# Patient Record
Sex: Female | Born: 1935 | Race: White | Hispanic: No | Smoking: Never smoker
Health system: Southern US, Community
[De-identification: ages and names within clinical notes are randomized; demographics above are authoritative.]

## PROBLEM LIST (undated history)

## (undated) DIAGNOSIS — E119 Type 2 diabetes mellitus without complications: Secondary | ICD-10-CM

## (undated) DIAGNOSIS — I10 Essential (primary) hypertension: Secondary | ICD-10-CM

## (undated) DIAGNOSIS — R112 Nausea with vomiting, unspecified: Secondary | ICD-10-CM

## (undated) DIAGNOSIS — Z9889 Other specified postprocedural states: Secondary | ICD-10-CM

## (undated) DIAGNOSIS — H9190 Unspecified hearing loss, unspecified ear: Secondary | ICD-10-CM

## (undated) DIAGNOSIS — M1990 Unspecified osteoarthritis, unspecified site: Secondary | ICD-10-CM

## (undated) DIAGNOSIS — E7800 Pure hypercholesterolemia, unspecified: Secondary | ICD-10-CM

## (undated) DIAGNOSIS — H8109 Meniere's disease, unspecified ear: Secondary | ICD-10-CM

## (undated) HISTORY — PX: CATARACT EXTRACTION: SUR2

## (undated) HISTORY — PX: ROTATOR CUFF REPAIR: SHX139

## (undated) HISTORY — PX: TONSILLECTOMY: SUR1361

## (undated) HISTORY — PX: OTHER SURGICAL HISTORY: SHX169

## (undated) HISTORY — PX: JOINT REPLACEMENT: SHX530

## (undated) HISTORY — PX: KNEE ARTHROSCOPY: SUR90

## (undated) HISTORY — PX: CERVICAL FUSION: SHX112

## (undated) HISTORY — PX: ABDOMINAL HYSTERECTOMY: SHX81

## (undated) HISTORY — PX: CHOLECYSTECTOMY: SHX55

---

## 2005-12-16 ENCOUNTER — Emergency Department: Payer: Self-pay | Admitting: Emergency Medicine

## 2005-12-20 ENCOUNTER — Emergency Department: Payer: Self-pay | Admitting: Emergency Medicine

## 2005-12-20 ENCOUNTER — Other Ambulatory Visit: Payer: Self-pay

## 2005-12-24 ENCOUNTER — Emergency Department: Payer: Self-pay | Admitting: Emergency Medicine

## 2006-07-15 ENCOUNTER — Ambulatory Visit: Payer: Self-pay | Admitting: Family Medicine

## 2007-12-24 ENCOUNTER — Ambulatory Visit: Payer: Self-pay | Admitting: Internal Medicine

## 2007-12-26 ENCOUNTER — Ambulatory Visit: Payer: Self-pay | Admitting: Internal Medicine

## 2008-01-02 ENCOUNTER — Ambulatory Visit: Payer: Self-pay | Admitting: Family Medicine

## 2009-09-20 ENCOUNTER — Ambulatory Visit: Payer: Self-pay | Admitting: Ophthalmology

## 2009-10-06 ENCOUNTER — Ambulatory Visit: Payer: Self-pay | Admitting: Unknown Physician Specialty

## 2010-10-10 ENCOUNTER — Ambulatory Visit: Payer: Self-pay | Admitting: Unknown Physician Specialty

## 2010-11-21 ENCOUNTER — Emergency Department: Payer: Self-pay | Admitting: Emergency Medicine

## 2011-07-03 IMAGING — CR NASAL BONES - 3+ VIEW
1 series · 3 of 3 positions shown · non-contrast
Comparison: none

REASON FOR EXAM: swelling and pain after a fall
COMMENTS:

[Series 1: view not recorded · 0.17mm/px · 3 of 3 slices shown]
[im 1/3]
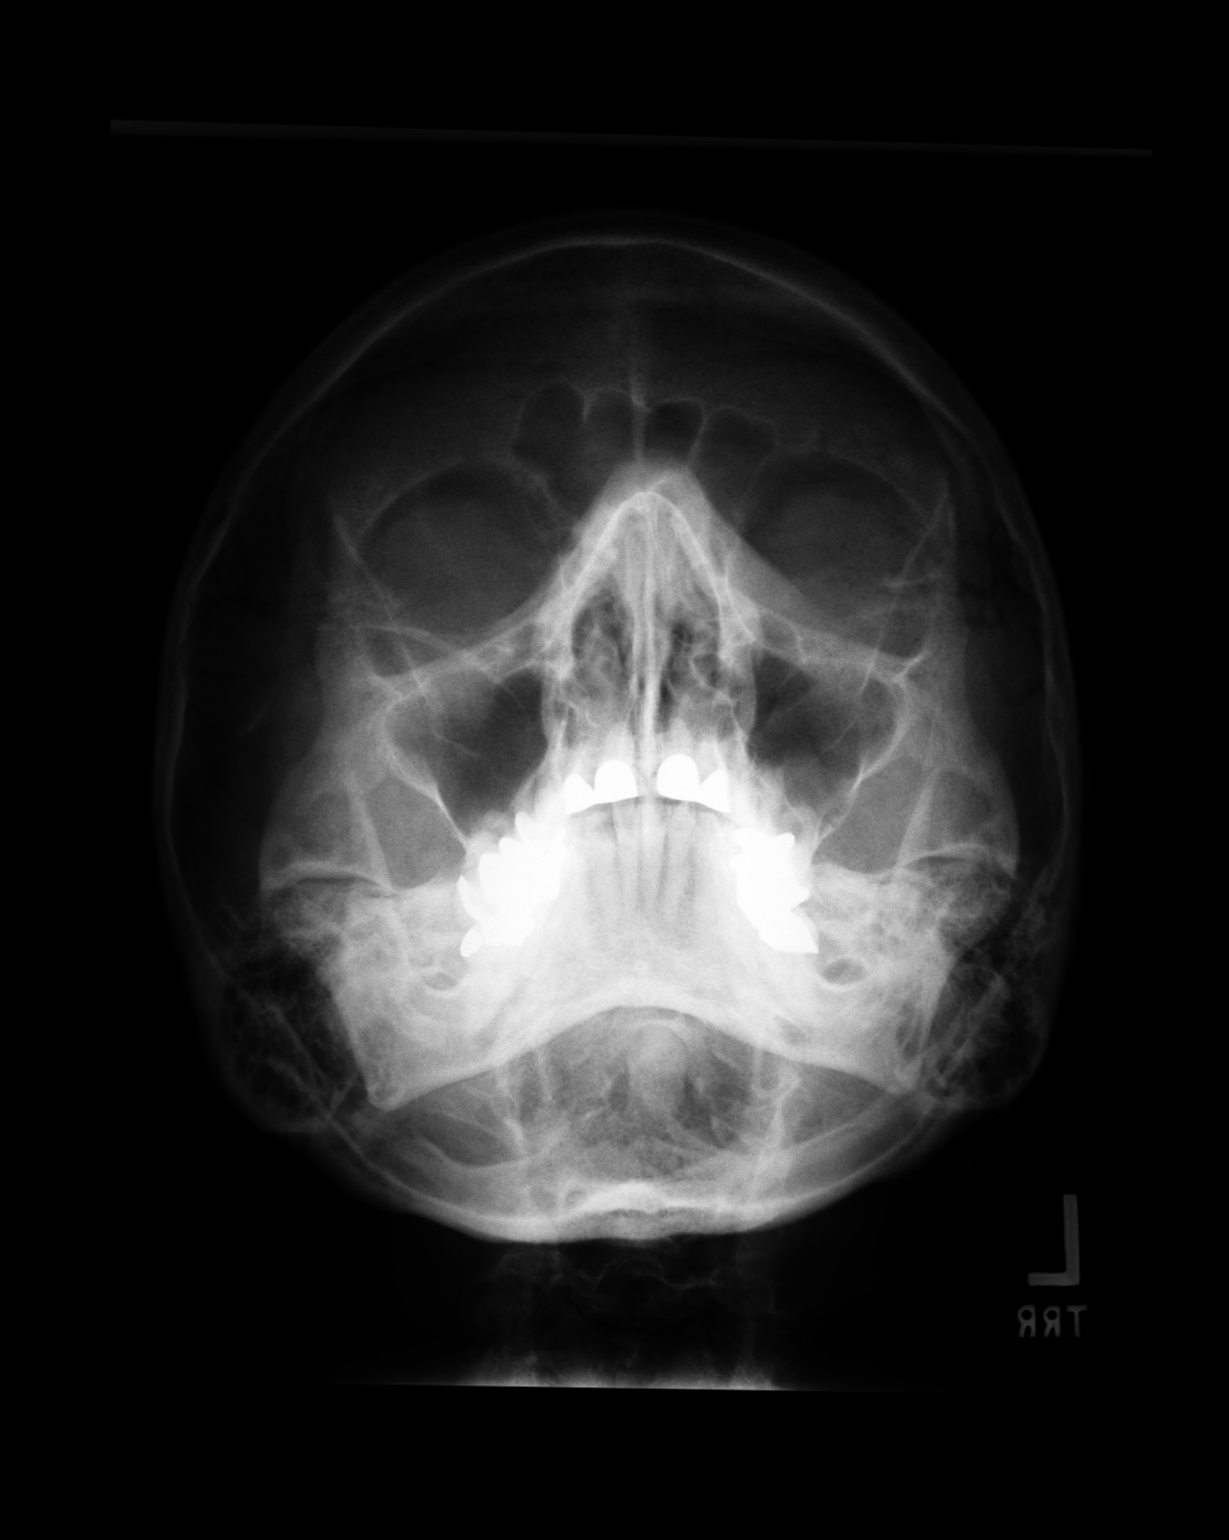
[im 2/3]
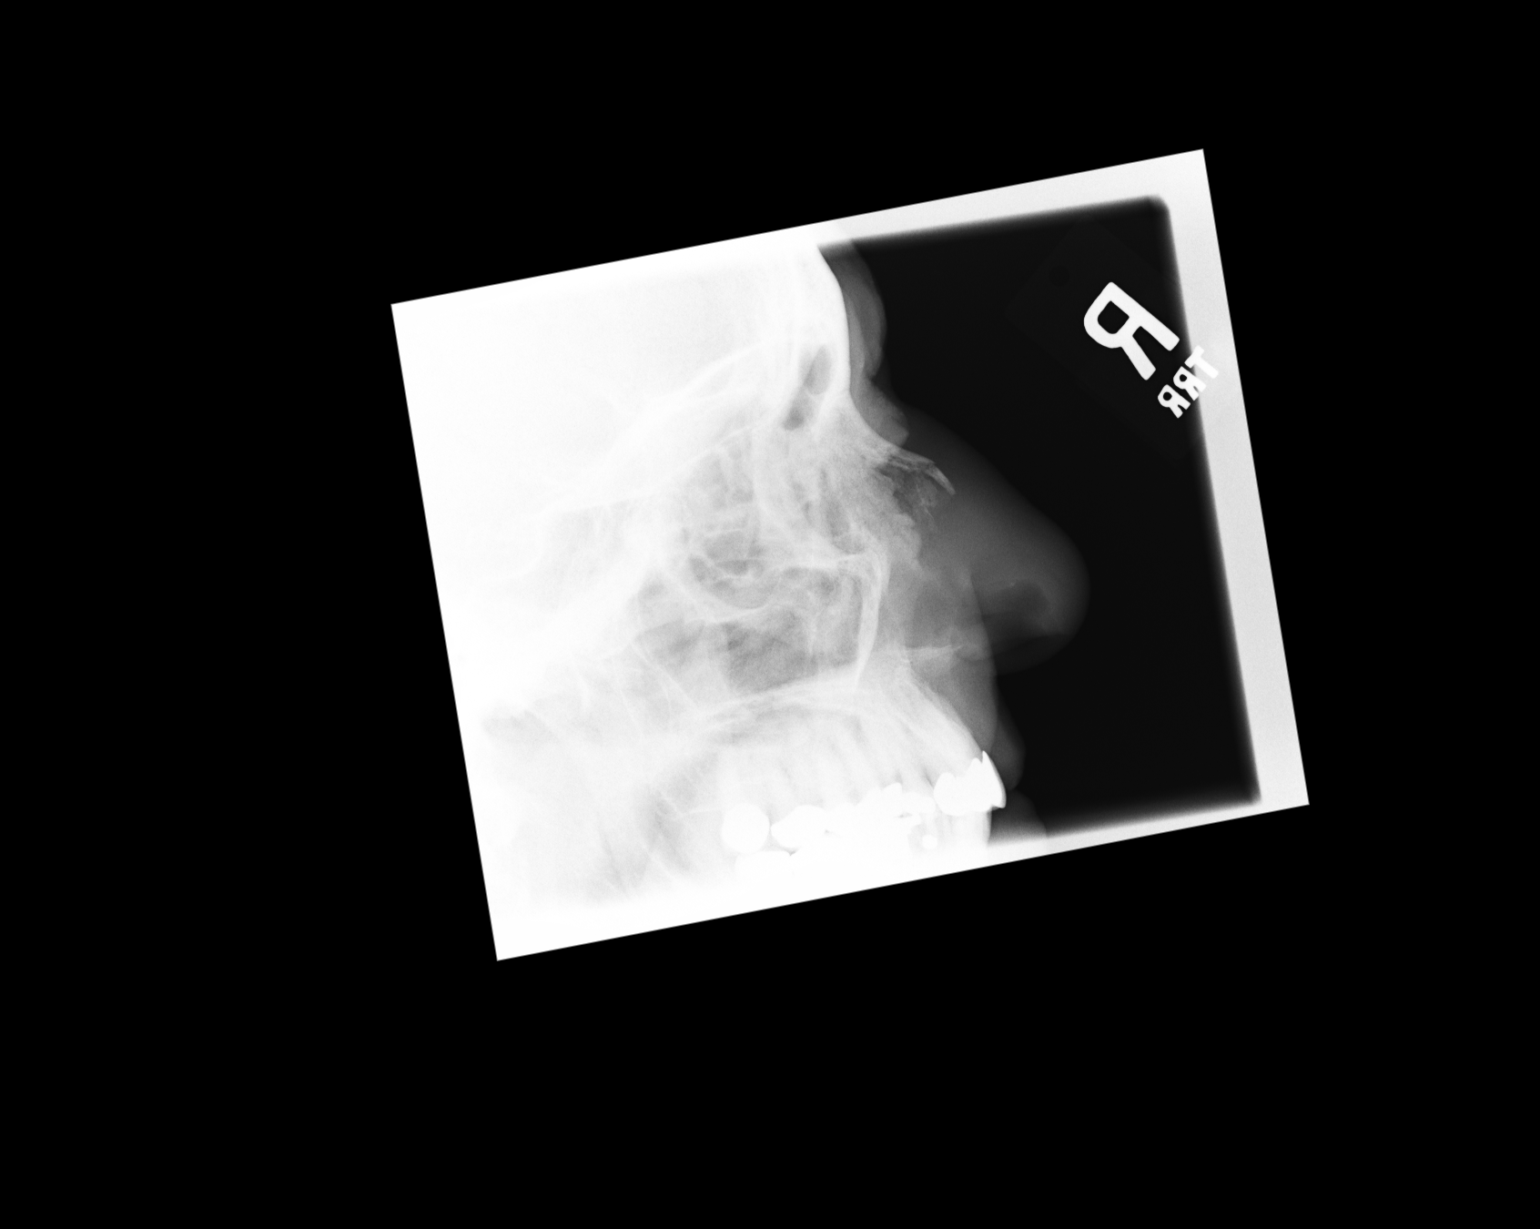
[im 3/3]
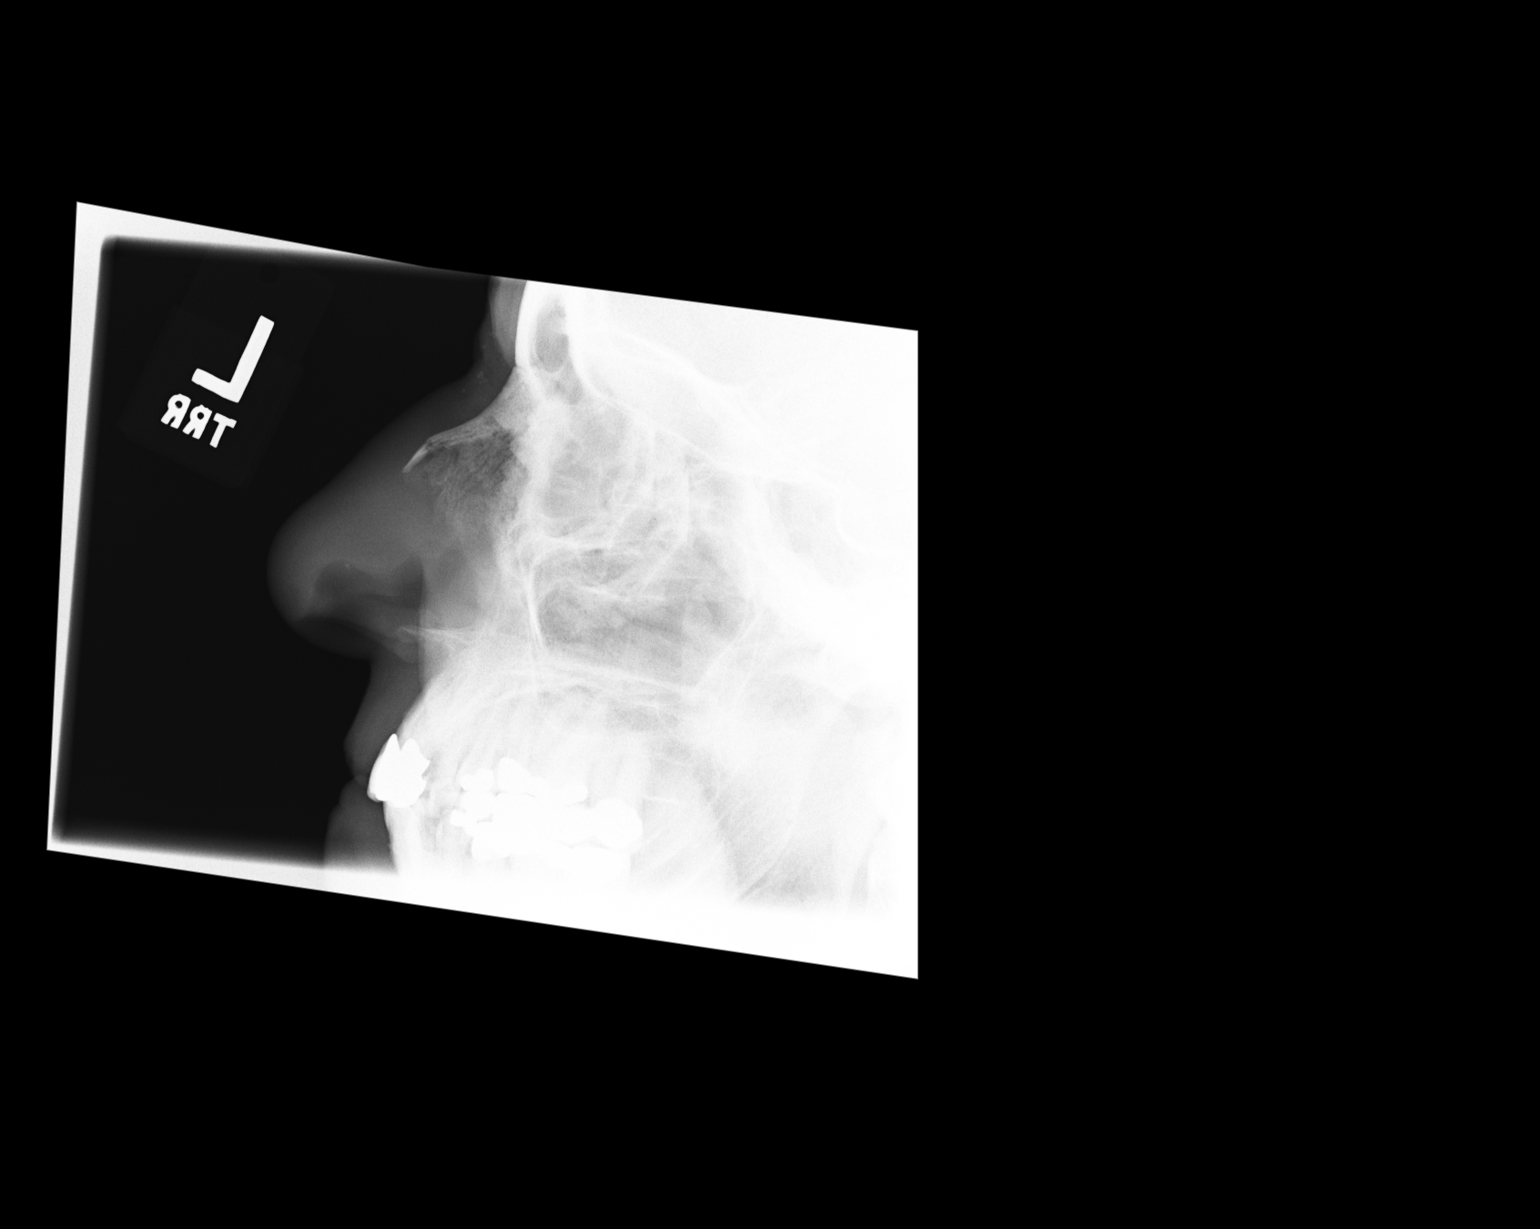

[3 of 3 positions shown; findings below may reference images not displayed]

PROCEDURE:     DXR - DXR NASAL BONES  - November 21, 2010  [DATE]

RESULT:     Three views of the nasal bones are submitted. The patient has
sustained an acute fracture which is slightly depressed through the tip of
the nasal bone. The nasal spine is grossly intact. As best as can be
determined the nasal septum is not displaced.
IMPRESSION: The patient has sustained a slightly depressed fracture
through the tip of the nasal bone. The degree of depression is no more than
1 to 2 mm.

## 2012-04-11 DIAGNOSIS — E119 Type 2 diabetes mellitus without complications: Secondary | ICD-10-CM | POA: Insufficient documentation

## 2013-01-26 DIAGNOSIS — Z96659 Presence of unspecified artificial knee joint: Secondary | ICD-10-CM | POA: Insufficient documentation

## 2013-10-12 DIAGNOSIS — T84069A Wear of articular bearing surface of unspecified internal prosthetic joint, initial encounter: Secondary | ICD-10-CM | POA: Insufficient documentation

## 2013-12-16 DIAGNOSIS — G8918 Other acute postprocedural pain: Secondary | ICD-10-CM | POA: Insufficient documentation

## 2014-02-08 ENCOUNTER — Ambulatory Visit: Payer: MEDICARE | Admitting: Podiatry

## 2014-02-24 ENCOUNTER — Ambulatory Visit (INDEPENDENT_AMBULATORY_CARE_PROVIDER_SITE_OTHER): Payer: MEDICARE | Admitting: Podiatry

## 2014-02-24 ENCOUNTER — Ambulatory Visit (INDEPENDENT_AMBULATORY_CARE_PROVIDER_SITE_OTHER): Payer: MEDICARE

## 2014-02-24 ENCOUNTER — Encounter: Payer: Self-pay | Admitting: Podiatry

## 2014-02-24 VITALS — BP 145/81 | HR 91 | Resp 16 | Ht 62.0 in | Wt 145.0 lb

## 2014-02-24 DIAGNOSIS — M2040 Other hammer toe(s) (acquired), unspecified foot: Secondary | ICD-10-CM

## 2014-02-24 NOTE — Progress Notes (Signed)
   Subjective:    Patient ID: Kristin Deleon, female    DOB: 02/27/1928, 80 y.o.   MRN: [id number]  HPI Comments: i have a hammer toe on my rt foot - 2nd. i have bunionettes on both feet. i want him to look at my inserts. Ive had feet problems since June 2015. My feet are stable. My hammer toe hurts. As long as i keep a Band-Aid on my toe it will not hurt. i dont do anything for my feet.  Foot Pain      Review of Systems  HENT: Positive for hearing loss.        Ringing in ears  Musculoskeletal:       Joint pain  All other systems reviewed and are negative.      Objective:   Physical Exam: I have reviewed her past medical history medications allergies surgeries social history and review of systems. Pulses are strongly palpable bilateral. Neurologic sensorium is intact per Semmes-Weinstein monofilament. Deep tendon reflexes are intact bilateral muscle strength is 5 over 5 dorsiflexors plantar flexors inverters everters all intrinsic musculature is intact. 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 I feel that this is more than likely level her off to some degree if she is having less symptomatology. She is however developing a hammertoe deformity with medial and dislocation of the second metatarsophalangeal joint associated with a plantar plate tear more than likely secondary to a history of walking with a severe limp for long period time. Cutaneous evaluation Mr. Is supple well hydrated cutis there is no erythema edema cellulitis drainage or odor. Radiographic evaluation demonstrates osteoarthritis and osteopenia. Hammertoe deformities are noted.   Assessment: Leg length discrepancy with hammertoe deformity  Plan: Continue the use of her orthotics of followup with me as needed.       Assessment & Plan:

## 2014-06-23 DIAGNOSIS — G8929 Other chronic pain: Secondary | ICD-10-CM | POA: Insufficient documentation

## 2014-06-23 DIAGNOSIS — M25512 Pain in left shoulder: Secondary | ICD-10-CM | POA: Insufficient documentation

## 2014-06-23 DIAGNOSIS — M25551 Pain in right hip: Secondary | ICD-10-CM | POA: Insufficient documentation

## 2014-06-23 DIAGNOSIS — M25552 Pain in left hip: Secondary | ICD-10-CM | POA: Insufficient documentation

## 2014-11-06 DIAGNOSIS — S12110K Anterior displaced Type II dens fracture, subsequent encounter for fracture with nonunion: Secondary | ICD-10-CM | POA: Insufficient documentation

## 2014-12-14 DIAGNOSIS — M4322 Fusion of spine, cervical region: Secondary | ICD-10-CM | POA: Insufficient documentation

## 2016-08-23 ENCOUNTER — Other Ambulatory Visit: Payer: Self-pay | Admitting: Family Medicine

## 2016-08-23 DIAGNOSIS — E041 Nontoxic single thyroid nodule: Secondary | ICD-10-CM

## 2016-08-28 ENCOUNTER — Ambulatory Visit
Admission: RE | Admit: 2016-08-28 | Discharge: 2016-08-28 | Disposition: A | Discharge status: Home / Self Care | Payer: MEDICARE | Source: Ambulatory Visit | Attending: Family Medicine | Admitting: Family Medicine

## 2016-08-28 DIAGNOSIS — E041 Nontoxic single thyroid nodule: Secondary | ICD-10-CM | POA: Diagnosis present

## 2016-08-28 DIAGNOSIS — E042 Nontoxic multinodular goiter: Secondary | ICD-10-CM | POA: Diagnosis not present

## 2016-09-18 NOTE — Discharge Instructions (Signed)
Cataract Surgery, Care After °Refer to this sheet in the next few weeks. These instructions provide you with information about caring for yourself after your procedure. Your health care provider may also give you more specific instructions. Your treatment has been planned according to current medical practices, but problems sometimes occur. Call your health care provider if you have any problems or questions after your procedure. °What can I expect after the procedure? °After the procedure, it is common to have: °· Itching. °· Discomfort. °· Fluid discharge. °· Sensitivity to light and to touch. °· Bruising. °Follow these instructions at home: °Eye Care  °· Check your eye every day for signs of infection. Watch for: °¨ Redness, swelling, or pain. °¨ Fluid, blood, or pus. °¨ Warmth. °¨ Bad smell. °Activity  °· Avoid strenuous activities, such as playing contact sports, for as long as told by your health care provider. °· Do not drive or operate heavy machinery until your health care provider approves. °· Do not bend or lift heavy objects . Bending increases pressure in the eye. You can walk, climb stairs, and do light household chores. °· Ask your health care provider when you can return to work. If you work in a dusty environment, you may be advised to wear protective eyewear for a period of time. °General instructions  °· Take or apply over-the-counter and prescription medicines only as told by your health care provider. This includes eye drops. °· Do not touch or rub your eyes. °· If you were given a protective shield, wear it as told by your health care provider. If you were not given a protective shield, wear sunglasses as told by your health care provider to protect your eyes. °· Keep the area around your eye clean and dry. Avoid swimming or allowing water to hit you directly in the face while showering until told by your health care provider. Keep soap and shampoo out of your eyes. °· Do not put a contact lens  into the affected eye or eyes until your health care provider approves. °· Keep all follow-up visits as told by your health care provider. This is important. °Contact a health care provider if: ° °· You have increased bruising around your eye. °· You have pain that is not helped with medicine. °· You have a fever. °· You have redness, swelling, or pain in your eye. °· You have fluid, blood, or pus coming from your incision. °· Your vision gets worse. °Get help right away if: °· You have sudden vision loss. °This information is not intended to replace advice given to you by your health care provider. Make sure you discuss any questions you have with your health care provider. °Document Released: 12/15/2004 Document Revised: 10/06/2015 Document Reviewed: 04/07/2015 °Elsevier Interactive Patient Education © 2017 [employer] ° ° ° ° °General Anesthesia, Adult, Care After °These instructions provide you with information about caring for yourself after your procedure. Your health care provider may also give you more specific instructions. Your treatment has been planned according to current medical practices, but problems sometimes occur. Call your health care provider if you have any problems or questions after your procedure. °What can I expect after the procedure? °After the procedure, it is common to have: °· Vomiting. °· A sore throat. °· Mental slowness. °It is common to feel: °· Nauseous. °· Cold or shivery. °· Sleepy. °· Tired. °· Sore or achy, even in parts of your body where you did not have surgery. °Follow these instructions at   home: °For at least 24 hours after the procedure:  °· Do not: °¨ Participate in activities where you could fall or become injured. °¨ Drive. °¨ Use heavy machinery. °¨ Drink alcohol. °¨ Take sleeping pills or medicines that cause drowsiness. °¨ Make important decisions or sign legal documents. °¨ Take care of children on your own. °· Rest. °Eating and drinking  °· If you vomit, drink  water, juice, or soup when you can drink without vomiting. °· Drink enough fluid to keep your urine clear or pale yellow. °· Make sure you have little or no nausea before eating solid foods. °· Follow the diet recommended by your health care provider. °General instructions  °· Have a responsible adult stay with you until you are awake and alert. °· Return to your normal activities as told by your health care provider. Ask your health care provider what activities are safe for you. °· Take over-the-counter and prescription medicines only as told by your health care provider. °· If you smoke, do not smoke without supervision. °· Keep all follow-up visits as told by your health care provider. This is important. °Contact a health care provider if: °· You continue to have nausea or vomiting at home, and medicines are not helpful. °· You cannot drink fluids or start eating again. °· You cannot urinate after 8-12 hours. °· You develop a skin rash. °· You have fever. °· You have increasing redness at the site of your procedure. °Get help right away if: °· You have difficulty breathing. °· You have chest pain. °· You have unexpected bleeding. °· You feel that you are having a life-threatening or urgent problem. °This information is not intended to replace advice given to you by your health care provider. Make sure you discuss any questions you have with your health care provider. °Document Released: 09/03/2000 Document Revised: 10/31/2015 Document Reviewed: 05/12/2015 °Elsevier Interactive Patient Education © 2017 [employer] ° °

## 2016-09-19 ENCOUNTER — Ambulatory Visit: Payer: MEDICARE | Admitting: Student in an Organized Health Care Education/Training Program

## 2016-09-19 ENCOUNTER — Encounter
Admission: RE | Disposition: A | Discharge status: Home / Self Care | Payer: Self-pay | Source: Ambulatory Visit | Attending: Ophthalmology

## 2016-09-19 ENCOUNTER — Ambulatory Visit
Admission: RE | Admit: 2016-09-19 | Discharge: 2016-09-19 | Disposition: A | Discharge status: Home / Self Care | Payer: MEDICARE | Source: Ambulatory Visit | Attending: Ophthalmology | Admitting: Ophthalmology

## 2016-09-19 DIAGNOSIS — Z96653 Presence of artificial knee joint, bilateral: Secondary | ICD-10-CM | POA: Insufficient documentation

## 2016-09-19 DIAGNOSIS — Z7984 Long term (current) use of oral hypoglycemic drugs: Secondary | ICD-10-CM | POA: Insufficient documentation

## 2016-09-19 DIAGNOSIS — Z888 Allergy status to other drugs, medicaments and biological substances status: Secondary | ICD-10-CM | POA: Diagnosis not present

## 2016-09-19 DIAGNOSIS — E7800 Pure hypercholesterolemia, unspecified: Secondary | ICD-10-CM | POA: Diagnosis not present

## 2016-09-19 DIAGNOSIS — Z885 Allergy status to narcotic agent status: Secondary | ICD-10-CM | POA: Insufficient documentation

## 2016-09-19 DIAGNOSIS — H2511 Age-related nuclear cataract, right eye: Secondary | ICD-10-CM | POA: Diagnosis present

## 2016-09-19 DIAGNOSIS — Z7982 Long term (current) use of aspirin: Secondary | ICD-10-CM | POA: Diagnosis not present

## 2016-09-19 DIAGNOSIS — Z96643 Presence of artificial hip joint, bilateral: Secondary | ICD-10-CM | POA: Insufficient documentation

## 2016-09-19 DIAGNOSIS — I10 Essential (primary) hypertension: Secondary | ICD-10-CM | POA: Insufficient documentation

## 2016-09-19 DIAGNOSIS — Z881 Allergy status to other antibiotic agents status: Secondary | ICD-10-CM | POA: Diagnosis not present

## 2016-09-19 DIAGNOSIS — E1136 Type 2 diabetes mellitus with diabetic cataract: Secondary | ICD-10-CM | POA: Insufficient documentation

## 2016-09-19 DIAGNOSIS — Z79899 Other long term (current) drug therapy: Secondary | ICD-10-CM | POA: Insufficient documentation

## 2016-09-19 HISTORY — DX: Type 2 diabetes mellitus without complications: E11.9

## 2016-09-19 HISTORY — PX: CATARACT EXTRACTION W/PHACO: SHX586

## 2016-09-19 HISTORY — DX: Unspecified osteoarthritis, unspecified site: M19.90

## 2016-09-19 HISTORY — DX: Meniere's disease, unspecified ear: H81.09

## 2016-09-19 HISTORY — DX: Nausea with vomiting, unspecified: R11.2

## 2016-09-19 HISTORY — DX: Nausea with vomiting, unspecified: Z98.890

## 2016-09-19 HISTORY — DX: Unspecified hearing loss, unspecified ear: H91.90

## 2016-09-19 HISTORY — DX: Pure hypercholesterolemia, unspecified: E78.00

## 2016-09-19 HISTORY — DX: Essential (primary) hypertension: I10

## 2016-09-19 LAB — GLUCOSE, CAPILLARY
GLUCOSE-CAPILLARY: 129 mg/dL — AB (ref 65–99)
Glucose-Capillary: 156 mg/dL — ABNORMAL HIGH (ref 65–99)

## 2016-09-19 SURGERY — PHACOEMULSIFICATION, CATARACT, WITH IOL INSERTION
Anesthesia: Monitor Anesthesia Care | Site: Eye | Laterality: Right | Wound class: Clean

## 2016-09-19 MED ORDER — FENTANYL CITRATE (PF) 100 MCG/2ML IJ SOLN
INTRAMUSCULAR | Status: DC | PRN
  Administered 2016-09-19 (×2): 50 ug via INTRAVENOUS

## 2016-09-19 MED ORDER — MIDAZOLAM HCL 2 MG/2ML IJ SOLN
INTRAMUSCULAR | Status: DC | PRN
  Administered 2016-09-19 (×2): 0.5 mg via INTRAVENOUS

## 2016-09-19 MED ORDER — LIDOCAINE HCL (PF) 2 % IJ SOLN
INTRAOCULAR | Status: DC | PRN
  Administered 2016-09-19: 1 mL via INTRAOCULAR

## 2016-09-19 MED ORDER — LACTATED RINGERS IV SOLN: INTRAVENOUS | Status: DC

## 2016-09-19 MED ORDER — ONDANSETRON HCL 4 MG/2ML IJ SOLN
4.0000 mg | Freq: Once | INTRAMUSCULAR | Status: AC
  Administered 2016-09-19: 2 mg via INTRAVENOUS

## 2016-09-19 MED ORDER — ARMC OPHTHALMIC DILATING DROPS
1.0000 "application " | OPHTHALMIC | Status: DC | PRN
  Administered 2016-09-19 (×3): 1 via OPHTHALMIC

## 2016-09-19 MED ORDER — MOXIFLOXACIN HCL 0.5 % OP SOLN
1.0000 [drp] | OPHTHALMIC | Status: DC | PRN
  Administered 2016-09-19 (×3): 1 [drp] via OPHTHALMIC

## 2016-09-19 MED ORDER — EPINEPHRINE PF 1 MG/ML IJ SOLN
INTRAOCULAR | Status: DC | PRN
  Administered 2016-09-19: 56 mL via OPHTHALMIC

## 2016-09-19 MED ORDER — CEFUROXIME OPHTHALMIC INJECTION 1 MG/0.1 ML
INJECTION | OPHTHALMIC | Status: DC | PRN
Start: 2016-09-19 — End: 2016-09-19
  Administered 2016-09-19: 0.1 mL via INTRACAMERAL

## 2016-09-19 MED ORDER — BRIMONIDINE TARTRATE-TIMOLOL 0.2-0.5 % OP SOLN
OPHTHALMIC | Status: DC | PRN
  Administered 2016-09-19: 1 [drp] via OPHTHALMIC

## 2016-09-19 MED ORDER — NA HYALUR & NA CHOND-NA HYALUR 0.4-0.35 ML IO KIT
PACK | INTRAOCULAR | Status: DC | PRN
  Administered 2016-09-19: 1 mL via INTRAOCULAR

## 2016-09-19 SURGICAL SUPPLY — 26 items
CANNULA ANT/CHMB 27GA (MISCELLANEOUS) ×2 IMPLANT
CARTRIDGE ABBOTT (MISCELLANEOUS) IMPLANT
GLOVE SURG LX 7.5 STRW (GLOVE) ×1
GLOVE SURG LX STRL 7.5 STRW (GLOVE) ×1 IMPLANT
GLOVE SURG TRIUMPH 8.0 PF LTX (GLOVE) ×2 IMPLANT
GOWN STRL REUS W/ TWL LRG LVL3 (GOWN DISPOSABLE) ×2 IMPLANT
GOWN STRL REUS W/TWL LRG LVL3 (GOWN DISPOSABLE) ×2
LENS IOL ACRSF IQ ULTRA 24.0 (Intraocular Lens) ×1 IMPLANT
LENS IOL ACRYSOF IQ 24.0 (Intraocular Lens) ×2 IMPLANT
MARKER SKIN DUAL TIP RULER LAB (MISCELLANEOUS) ×2 IMPLANT
NDL RETROBULBAR .5 NSTRL (NEEDLE) IMPLANT
NEEDLE FILTER BLUNT 18X 1/2SAF (NEEDLE) ×1
NEEDLE FILTER BLUNT 18X1 1/2 (NEEDLE) ×1 IMPLANT
PACK CATARACT BRASINGTON (MISCELLANEOUS) ×2 IMPLANT
PACK EYE AFTER SURG (MISCELLANEOUS) ×2 IMPLANT
PACK OPTHALMIC (MISCELLANEOUS) ×2 IMPLANT
RING MALYGIN 7.0 (MISCELLANEOUS) IMPLANT
SUT ETHILON 10-0 CS-B-6CS-B-6 (SUTURE)
SUT VICRYL  9 0 (SUTURE)
SUT VICRYL 9 0 (SUTURE) IMPLANT
SUTURE EHLN 10-0 CS-B-6CS-B-6 (SUTURE) IMPLANT
SYR 3ML LL SCALE MARK (SYRINGE) ×2 IMPLANT
SYR 5ML LL (SYRINGE) ×2 IMPLANT
SYR TB 1ML LUER SLIP (SYRINGE) ×2 IMPLANT
WATER STERILE IRR 250ML POUR (IV SOLUTION) ×2 IMPLANT
WIPE NON LINTING 3.25X3.25 (MISCELLANEOUS) ×2 IMPLANT

## 2016-09-19 NOTE — Transfer of Care (Signed)
Immediate Anesthesia Transfer of Care Note  Patient: Kristin Deleon  Procedure(s) Performed: Procedure(s) with comments: CATARACT EXTRACTION PHACO AND INTRAOCULAR LENS PLACEMENT (IOC) Diabetic  Right eye (Right) - Diabetic-oral med  Patient Location: PACU  Anesthesia Type: MAC  Level of Consciousness: awake, alert  and patient cooperative  Airway and Oxygen Therapy: Patient Spontanous Breathing and Patient connected to supplemental oxygen  Post-op Assessment: Post-op Vital signs reviewed, Patient's Cardiovascular Status Stable, Respiratory Function Stable, Patent Airway and No signs of Nausea or vomiting  Post-op Vital Signs: Reviewed and stable  Complications: No apparent anesthesia complications

## 2016-09-19 NOTE — Op Note (Signed)
LOCATION:  [facility]   PREOPERATIVE DIAGNOSIS:    Nuclear sclerotic cataract right eye. H25.11   POSTOPERATIVE DIAGNOSIS:  Nuclear sclerotic cataract right eye.     PROCEDURE:  Phacoemusification with posterior chamber intraocular lens placement of the right eye   LENS:   Implant Name Type Inv. Item Serial No. Manufacturer Lot No. LRB No. Used  LENS IOL ACRYSOF IQ 24.0 - [id number] Intraocular Lens LENS IOL ACRYSOF IQ 24.0 [id number] [employer]CON [id number] Right 1        ULTRASOUND TIME: 22 % of 1 minutes, 18 seconds.  CDE 17.0   SURGEON:  Deirdre Evener, MD   ANESTHESIA:  Topical with tetracaine drops and 2% Xylocaine jelly, augmented with 1% preservative-free intracameral lidocaine.    COMPLICATIONS:  None.   DESCRIPTION OF PROCEDURE:  The patient was identified in the holding room and transported to the operating room and placed in the supine position under the operating microscope.  The right eye was identified as the operative eye and it was prepped and draped in the usual sterile ophthalmic fashion.   A 1 millimeter clear-corneal paracentesis was made at the 12:00 position.  0.5 ml of preservative-free 1% lidocaine was injected into the anterior chamber. The anterior chamber was filled with Viscoat viscoelastic.  A 2.4 millimeter keratome was used to make a near-clear corneal incision at the 9:00 position.  A curvilinear capsulorrhexis was made with a cystotome and capsulorrhexis forceps.  Balanced salt solution was used to hydrodissect and hydrodelineate the nucleus.   Phacoemulsification was then used in stop and chop fashion to remove the lens nucleus and epinucleus.  The remaining cortex was then removed using the irrigation and aspiration handpiece. Provisc was then placed into the capsular bag to distend it for lens placement.  A lens was then injected into the capsular bag.  The remaining viscoelastic was aspirated.   Wounds were hydrated with balanced salt  solution.  The anterior chamber was inflated to a physiologic pressure with balanced salt solution.  No wound leaks were noted. Cefuroxime 0.1 ml of a /ml solution was injected into the anterior chamber for a dose of 1 mg of intracameral antibiotic at the completion of the case.   Timolol and Brimonidine drops were applied to the eye.  The patient was taken to the recovery room in stable condition without complications of anesthesia or surgery.   Kristin Deleon 09/19/2016, 8:38 AM

## 2016-09-19 NOTE — Anesthesia Preprocedure Evaluation (Signed)
Anesthesia Evaluation  Patient identified by MRN, date of birth, ID band Patient awake    Reviewed: Allergy & Precautions, H&P , NPO status , Patient's Chart, lab work & pertinent test results  Airway Mallampati: II  TM Distance: >3 FB Neck ROM: limited   Comment: h/o C1-C2 fusion Dental no notable dental hx.    Pulmonary neg pulmonary ROS,    Pulmonary exam normal        Cardiovascular hypertension, On Medications Normal cardiovascular exam     Neuro/Psych    GI/Hepatic negative GI ROS, Neg liver ROS,   Endo/Other  diabetes, Well Controlled  Renal/GU      Musculoskeletal   Abdominal   Peds  Hematology negative hematology ROS (+)   Anesthesia Other Findings   Reproductive/Obstetrics                             Anesthesia Physical Anesthesia Plan  ASA: II  Anesthesia Plan: MAC   Post-op Pain Management:    Induction:   Airway Management Planned:   Additional Equipment:   Intra-op Plan:   Post-operative Plan:   Informed Consent: I have reviewed the patients History and Physical, chart, labs and discussed the procedure including the risks, benefits and alternatives for the proposed anesthesia with the patient or authorized representative who has indicated his/her understanding and acceptance.     Plan Discussed with:   Anesthesia Plan Comments:         Anesthesia Quick Evaluation

## 2016-09-19 NOTE — Anesthesia Postprocedure Evaluation (Signed)
Anesthesia Post Note  Patient: Kristin Deleon  Procedure(s) Performed: Procedure(s) (LRB): CATARACT EXTRACTION PHACO AND INTRAOCULAR LENS PLACEMENT (IOC) Diabetic  Right eye (Right)  Patient location during evaluation: PACU Anesthesia Type: MAC Level of consciousness: awake Pain management: pain level controlled Vital Signs Assessment: post-procedure vital signs reviewed and stable Respiratory status: spontaneous breathing Cardiovascular status: blood pressure returned to baseline Postop Assessment: no headache Anesthetic complications: no    Jaci Standard, III,  Katelee Schupp D

## 2016-09-19 NOTE — H&P (Signed)
The History and Physical notes are on paper, have been signed, and are to be scanned. The patient remains stable and unchanged from the H&P.   Previous H&P reviewed, patient examined, and there are no changes.  Martyn Timme 09/19/2016 7:45 AM

## 2016-09-19 NOTE — Anesthesia Procedure Notes (Signed)
Procedure Name: MAC Date/Time: 09/19/2016 8:18 AM Performed by: Janna Arch Pre-anesthesia Checklist: Patient identified, Emergency Drugs available, Suction available and Patient being monitored Patient Re-evaluated:Patient Re-evaluated prior to inductionOxygen Delivery Method: Nasal cannula

## 2016-09-20 ENCOUNTER — Encounter: Payer: Self-pay | Admitting: Ophthalmology

## 2017-05-22 ENCOUNTER — Ambulatory Visit
Admission: RE | Admit: 2017-05-22 | Discharge: 2017-05-22 | Disposition: A | Discharge status: Home / Self Care | Payer: MEDICARE | Source: Ambulatory Visit | Attending: Gastroenterology | Admitting: Gastroenterology

## 2017-05-22 ENCOUNTER — Other Ambulatory Visit: Payer: Self-pay | Admitting: Gastroenterology

## 2017-05-22 DIAGNOSIS — R1310 Dysphagia, unspecified: Secondary | ICD-10-CM

## 2017-05-22 DIAGNOSIS — Z9889 Other specified postprocedural states: Secondary | ICD-10-CM | POA: Diagnosis not present

## 2017-05-22 DIAGNOSIS — M5030 Other cervical disc degeneration, unspecified cervical region: Secondary | ICD-10-CM | POA: Diagnosis not present

## 2017-05-22 DIAGNOSIS — M5382 Other specified dorsopathies, cervical region: Secondary | ICD-10-CM | POA: Diagnosis not present

## 2017-05-27 ENCOUNTER — Encounter: Payer: Self-pay | Admitting: *Deleted

## 2017-05-28 ENCOUNTER — Ambulatory Visit: Payer: MEDICARE | Admitting: Anesthesiology

## 2017-05-28 ENCOUNTER — Ambulatory Visit
Admission: RE | Admit: 2017-05-28 | Discharge: 2017-05-28 | Disposition: A | Discharge status: Home / Self Care | Payer: MEDICARE | Source: Ambulatory Visit | Attending: Internal Medicine | Admitting: Internal Medicine

## 2017-05-28 ENCOUNTER — Encounter
Admission: RE | Disposition: A | Discharge status: Home / Self Care | Payer: Self-pay | Source: Ambulatory Visit | Attending: Internal Medicine

## 2017-05-28 ENCOUNTER — Encounter: Payer: Self-pay | Admitting: *Deleted

## 2017-05-28 DIAGNOSIS — K449 Diaphragmatic hernia without obstruction or gangrene: Secondary | ICD-10-CM | POA: Insufficient documentation

## 2017-05-28 DIAGNOSIS — Z7982 Long term (current) use of aspirin: Secondary | ICD-10-CM | POA: Diagnosis not present

## 2017-05-28 DIAGNOSIS — R1313 Dysphagia, pharyngeal phase: Secondary | ICD-10-CM | POA: Insufficient documentation

## 2017-05-28 DIAGNOSIS — Z881 Allergy status to other antibiotic agents status: Secondary | ICD-10-CM | POA: Diagnosis not present

## 2017-05-28 DIAGNOSIS — K2970 Gastritis, unspecified, without bleeding: Secondary | ICD-10-CM | POA: Insufficient documentation

## 2017-05-28 DIAGNOSIS — E119 Type 2 diabetes mellitus without complications: Secondary | ICD-10-CM | POA: Insufficient documentation

## 2017-05-28 DIAGNOSIS — Z7984 Long term (current) use of oral hypoglycemic drugs: Secondary | ICD-10-CM | POA: Insufficient documentation

## 2017-05-28 DIAGNOSIS — E7800 Pure hypercholesterolemia, unspecified: Secondary | ICD-10-CM | POA: Diagnosis not present

## 2017-05-28 DIAGNOSIS — M1990 Unspecified osteoarthritis, unspecified site: Secondary | ICD-10-CM | POA: Diagnosis not present

## 2017-05-28 DIAGNOSIS — Q398 Other congenital malformations of esophagus: Secondary | ICD-10-CM | POA: Diagnosis not present

## 2017-05-28 DIAGNOSIS — Z79899 Other long term (current) drug therapy: Secondary | ICD-10-CM | POA: Diagnosis not present

## 2017-05-28 DIAGNOSIS — I10 Essential (primary) hypertension: Secondary | ICD-10-CM | POA: Diagnosis not present

## 2017-05-28 DIAGNOSIS — M180 Bilateral primary osteoarthritis of first carpometacarpal joints: Secondary | ICD-10-CM | POA: Insufficient documentation

## 2017-05-28 DIAGNOSIS — M47812 Spondylosis without myelopathy or radiculopathy, cervical region: Secondary | ICD-10-CM | POA: Insufficient documentation

## 2017-05-28 DIAGNOSIS — Z885 Allergy status to narcotic agent status: Secondary | ICD-10-CM | POA: Insufficient documentation

## 2017-05-28 DIAGNOSIS — Z888 Allergy status to other drugs, medicaments and biological substances status: Secondary | ICD-10-CM | POA: Insufficient documentation

## 2017-05-28 HISTORY — PX: ESOPHAGOGASTRODUODENOSCOPY (EGD) WITH PROPOFOL: SHX5813

## 2017-05-28 LAB — GLUCOSE, CAPILLARY: Glucose-Capillary: 101 mg/dL — ABNORMAL HIGH (ref 65–99)

## 2017-05-28 SURGERY — ESOPHAGOGASTRODUODENOSCOPY (EGD) WITH PROPOFOL
Anesthesia: General

## 2017-05-28 MED ORDER — LIDOCAINE HCL (CARDIAC) 20 MG/ML IV SOLN
INTRAVENOUS | Status: DC | PRN
  Administered 2017-05-28: 50 mg via INTRAVENOUS

## 2017-05-28 MED ORDER — LIDOCAINE HCL (PF) 2 % IJ SOLN
INTRAMUSCULAR | Status: AC
  Filled 2017-05-28: qty 10

## 2017-05-28 MED ORDER — PROPOFOL 500 MG/50ML IV EMUL
INTRAVENOUS | Status: AC
  Filled 2017-05-28: qty 50

## 2017-05-28 MED ORDER — PROPOFOL 10 MG/ML IV BOLUS
INTRAVENOUS | Status: DC | PRN
  Administered 2017-05-28: 10 mg via INTRAVENOUS
  Administered 2017-05-28: 20 mg via INTRAVENOUS
  Administered 2017-05-28: 10 mg via INTRAVENOUS
  Administered 2017-05-28: 40 mg via INTRAVENOUS

## 2017-05-28 MED ORDER — SODIUM CHLORIDE 0.9 % IV SOLN
INTRAVENOUS | Status: DC | PRN
  Administered 2017-05-28: 13:00:00 via INTRAVENOUS

## 2017-05-28 NOTE — Anesthesia Preprocedure Evaluation (Signed)
Anesthesia Evaluation  Patient identified by MRN, date of birth, ID band Patient awake    Reviewed: Allergy & Precautions, NPO status , Patient's Chart, lab work & pertinent test results  History of Anesthesia Complications (+) PONV  Airway Mallampati: II       Dental  (+) Teeth Intact   Pulmonary neg pulmonary ROS,    breath sounds clear to auscultation       Cardiovascular Exercise Tolerance: Good hypertension, Pt. on medications  Rhythm:Regular Rate:Normal     Neuro/Psych negative neurological ROS  negative psych ROS   GI/Hepatic negative GI ROS, Neg liver ROS,   Endo/Other  diabetes, Type 2, Oral Hypoglycemic Agents  Renal/GU      Musculoskeletal  (+) Arthritis ,   Abdominal Normal abdominal exam  (+)   Peds negative pediatric ROS (+)  Hematology   Anesthesia Other Findings   Reproductive/Obstetrics                             Anesthesia Physical Anesthesia Plan  ASA: II  Anesthesia Plan: General   Post-op Pain Management:    Induction: Intravenous  PONV Risk Score and Plan: 1 and Ondansetron and Dexamethasone  Airway Management Planned: Natural Airway and Nasal Cannula  Additional Equipment:   Intra-op Plan:   Post-operative Plan:   Informed Consent: I have reviewed the patients History and Physical, chart, labs and discussed the procedure including the risks, benefits and alternatives for the proposed anesthesia with the patient or authorized representative who has indicated his/her understanding and acceptance.     Plan Discussed with: CRNA  Anesthesia Plan Comments:         Anesthesia Quick Evaluation

## 2017-05-28 NOTE — Anesthesia Postprocedure Evaluation (Signed)
Anesthesia Post Note  Patient: Kristin Deleon  Procedure(s) Performed: ESOPHAGOGASTRODUODENOSCOPY (EGD) WITH PROPOFOL (N/A )  Patient location during evaluation: PACU Anesthesia Type: General Level of consciousness: awake Pain management: pain level controlled Vital Signs Assessment: post-procedure vital signs reviewed and stable Respiratory status: spontaneous breathing Cardiovascular status: stable Anesthetic complications: no     Last Vitals:  Vitals:   05/28/17 1345 05/28/17 1414  BP: (!) 159/93   Pulse: 70   Resp: 20   Temp: 37 C 36.6 C  SpO2: 93%     Last Pain:  Vitals:   05/28/17 1414  TempSrc: Tympanic                 VAN STAVEREN,Kamyiah Colantonio

## 2017-05-28 NOTE — Transfer of Care (Signed)
Immediate Anesthesia Transfer of Care Note  Patient: Kristin Deleon E Scholz  Procedure(s) Performed: ESOPHAGOGASTRODUODENOSCOPY (EGD) WITH PROPOFOL (N/A )  Patient Location: PACU  Anesthesia Type:General  Level of Consciousness: sedated and responds to stimulation  Airway & Oxygen Therapy: Patient Spontanous Breathing and Patient connected to nasal cannula oxygen  Post-op Assessment: Report given to RN and Post -op Vital signs reviewed and stable  Post vital signs: Reviewed and stable  Last Vitals:  Vitals:   05/28/17 1305 05/28/17 1345  BP: (!) 163/101 (!) 159/93  Pulse: 88 70  Resp: 20 20  Temp: (!) 36 C   SpO2:  93%    Last Pain:  Vitals:   05/28/17 1305  TempSrc: Oral         Complications: No apparent anesthesia complications

## 2017-05-28 NOTE — H&P (Signed)
Outpatient short stay form Pre-procedure 05/28/2017 1:04 PM Teodoro K. Norma Fredricksonoledo, M.D.  Primary Physician: Angus PalmsSionne George, M.D.  Reason for visit:  Dysphagia  History of present illness:  Patient is an 83 y/o female set up for EGD today by Vevelyn Pathristiane London, NP for dysphagia. Patient has hx of throat discomfort and cough while trying to swallow pills. 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 No Nsaids. Had a negative indirect laryngoscopy by ENT. No hx of stoke, seizures, parkinson's or other neuro diseases. No anticoagulant medications.   No current facility-administered medications for this encounter.   Medications Prior to Admission  Medication Sig Dispense Refill Last Dose  . ipratropium (ATROVENT) 0.03 % nasal spray Place 2 sprays into both nostrils 2 (two) times daily.     . niacin (NIASPAN) 500 MG CR tablet Take 500 mg by mouth at bedtime. Extended release     . triamcinolone cream (KENALOG) 0.1 % Apply 1 application topically as needed. Apply externally to the affected area twice daily     . aspirin EC 81 MG tablet Take 81 mg by mouth daily. am   09/18/2016 at Unknown time  . Calcium Carbonate-Vit D-Min (CALTRATE 600+D PLUS PO) Take by mouth. am   09/18/2016 at Unknown time  . Cholecalciferol (VITAMIN D3) 2000 units TABS Take by mouth daily. am   09/18/2016 at Unknown time  . CINNAMON PO Take 1,000 mg by mouth daily. am   09/18/2016 at Unknown time  . Coenzyme Q10 (COQ10) 200 MG CAPS Take by mouth. Dinner   09/18/2016 at Unknown time  . Cranberry-Cholecalciferol 4200-500 MG-UNIT CAPS Take by mouth daily. dinner   09/18/2016 at Unknown time  . diphenhydramine-acetaminophen (TYLENOL PM) 25-500 MG TABS tablet Take 1 tablet by mouth at bedtime as needed.   09/18/2016 at Unknown time  . Latanoprost (XALATAN OP) Apply 0.005 % to eye at bedtime. 1 drop per eye hour of sleep   09/18/2016 at Unknown time  . losartan (COZAAR) 50 MG tablet Take by mouth. breakfast   09/18/2016 at Unknown  time  . metFORMIN (GLUCOPHAGE) 1000 MG tablet Take 1,000 mg by mouth 2 (two) times daily with a meal. Breakfast and dinner   09/18/2016 at Unknown time  . Multiple Vitamins-Minerals (CENTRUM SILVER 50+WOMEN PO) Take by mouth. am   09/18/2016 at Unknown time  . Multiple Vitamins-Minerals (OCUVITE ADULT 50+ PO) Take by mouth daily. dinner   09/18/2016 at Unknown time  . Omega-3 Fatty Acids (FISH OIL) 1200 MG CAPS Take by mouth 2 (two) times daily. Breakfast and dinner   09/18/2016 at Unknown time  . Red Yeast Rice 600 MG CAPS Take 2 capsules by mouth daily. dinner   09/18/2016 at Unknown time  . vitamin B-12 (CYANOCOBALAMIN) 500 MCG tablet Take 500 mcg by mouth daily. dinner   09/18/2016 at Unknown time     Allergies  Allergen Reactions  . Codeine Nausea And Vomiting  . Fentanyl Nausea And Vomiting  . Hydromorphone Hcl Nausea And Vomiting  . Morphine Itching and Nausea And Vomiting    Other reaction(s): Vomiting/ feels like ants under skin  . Nucynta [Tapentadol] Other (See Comments)    hallucinations  . Statins     Muscle pain   . Tegaderm Ag Mesh [Silver] Other (See Comments)    blisters  . Tetracyclines & Related Rash     Past Medical History:  Diagnosis Date  . 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 Diabetes mellitus without complication (  HCC)    metformin-oral meds  . HOH (hard of hearing)    hearing aids/ bilateral  . Hypercholesteremia   . Hypertension    moderate/ controlled on meds  . Meniere syndrome   . PONV (postoperative nausea and vomiting)    difficulty waking up    Review of systems:      Physical Exam  General appearance: alert, cooperative and appears stated age Resp: clear to auscultation bilaterally Cardio: regular rate and rhythm, S1, S2 normal, no murmur, click, rub or gallop GI: soft, non-tender; bowel sounds normal; no masses,  no organomegaly     Planned procedures: EGD. The patient understands the nature of the planned procedure, indications, risks,  alternatives and potential complications including but not limited to bleeding, infection, perforation, damage to internal organs and possible oversedation/side effects from anesthesia. The patient agrees and gives consent to proceed.  Please refer to procedure notes for findings, recommendations and patient disposition/instructions.    Teodoro K. Norma Fredricksonoledo, M.D. Gastroenterology 05/28/2017  1:04 PM

## 2017-05-28 NOTE — Anesthesia Post-op Follow-up Note (Signed)
Anesthesia QCDR form completed.        

## 2017-05-28 NOTE — Anesthesia Procedure Notes (Signed)
Performed by: Anokhi Shannon, CRNA Pre-anesthesia Checklist: Patient identified, Emergency Drugs available, Suction available, Patient being monitored and Timeout performed Patient Re-evaluated:Patient Re-evaluated prior to induction Oxygen Delivery Method: Nasal cannula Induction Type: IV induction       

## 2017-05-28 NOTE — Op Note (Signed)
[facility] Patient Name: [name] Procedure Date: 05/28/2017 1:28 PM MRN: [id number] Account #: [id number] Date of Birth: 07-26-1927 Admit Type: Outpatient Age: 83 Room: [facility] ENDO ROOM 4 Gender: Female Note Status: Finalized Procedure:            Upper GI endoscopy Indications:          Pharyngeal phase dysphagia Providers:            Boykin Nearing. Norma Fredrickson MD, MD Referring MD:         Marylin Crosby. Greggory Stallion MD, MD (Referring MD) Medicines:            Propofol per Anesthesia Complications:        No immediate complications. Procedure:            Pre-Anesthesia Assessment:                       - The risks and benefits of the procedure and the                        sedation options and risks were discussed with the                        patient. All questions were answered and informed                        consent was obtained.                       - Patient identification and proposed procedure were                        verified prior to the procedure by the nurse. The                        procedure was verified in the procedure room.                       - ASA Grade Assessment: III - A patient with severe                        systemic disease.                       - After reviewing the risks and benefits, the patient                        was deemed in satisfactory condition to undergo the                        procedure.                       After obtaining informed consent, the endoscope was                        passed under direct vision. Throughout the procedure,                        the patient's blood pressure, pulse, and oxygen  saturations were monitored continuously. The Endoscope                        was introduced through the mouth, and advanced to the                        third part of duodenum. The upper GI endoscopy was                        accomplished without difficulty. The patient  tolerated                        the procedure well. Findings:      The mid esophagus was moderately tortuous.      There is no endoscopic evidence of stenosis, stricture or mass in the       lower third of the esophagus. Impression:           - Tortuous esophagus.                       - No specimens collected.                       - Gastritis.                       - Small sliding hiatal hernia. Recommendation:       - Patient has a contact number available for                        emergencies. The signs and symptoms of potential                        delayed complications were discussed with the patient.                        Return to normal activities tomorrow. Written discharge                        instructions were provided to the patient.                       - Resume previous diet.                       - Continue present medications.                       - I anticipate no further need for intervention.                       - Perform a modified barium swallow in 1 week.                       - Return to nurse practitioner in 1 month.                       - The findings and recommendations were discussed with                        the patient and their family. Procedure Code(s):    --- Professional ---  [id number], Esophagogastroduodenoscopy, flexible, transoral;                        diagnostic, including collection of specimen(s) by                        brushing or washing, when performed (separate procedure) Diagnosis Code(s):    --- Professional ---                       R13.13, Dysphagia, pharyngeal phase                       K44.9, Diaphragmatic hernia without obstruction or                        gangrene                       K29.70, Gastritis, unspecified, without bleeding                       Q39.9, Congenital malformation of esophagus, unspecified CPT copyright 2016 American Medical Association. All rights reserved. The codes  documented in this report are preliminary and upon coder review may  be revised to meet current compliance requirements. Stanton Kidneyeodoro K Latrish Mogel MD, MD 05/28/2017 1:46:34 PM This report has been signed electronically. Number of Addenda: 0 Note Initiated On: 05/28/2017 1:28 PM      [facility]

## 2017-05-29 ENCOUNTER — Other Ambulatory Visit: Payer: Self-pay | Admitting: Internal Medicine

## 2017-05-29 ENCOUNTER — Encounter: Payer: Self-pay | Admitting: Internal Medicine

## 2017-05-29 DIAGNOSIS — R1310 Dysphagia, unspecified: Secondary | ICD-10-CM

## 2017-06-14 ENCOUNTER — Ambulatory Visit
Admission: RE | Admit: 2017-06-14 | Discharge: 2017-06-14 | Disposition: A | Discharge status: Home / Self Care | Payer: Medicare HMO | Source: Ambulatory Visit | Attending: Internal Medicine | Admitting: Internal Medicine

## 2017-06-14 DIAGNOSIS — E119 Type 2 diabetes mellitus without complications: Secondary | ICD-10-CM | POA: Insufficient documentation

## 2017-06-14 DIAGNOSIS — M19041 Primary osteoarthritis, right hand: Secondary | ICD-10-CM | POA: Insufficient documentation

## 2017-06-14 DIAGNOSIS — H8109 Meniere's disease, unspecified ear: Secondary | ICD-10-CM | POA: Diagnosis not present

## 2017-06-14 DIAGNOSIS — E7800 Pure hypercholesterolemia, unspecified: Secondary | ICD-10-CM | POA: Insufficient documentation

## 2017-06-14 DIAGNOSIS — M19042 Primary osteoarthritis, left hand: Secondary | ICD-10-CM | POA: Insufficient documentation

## 2017-06-14 DIAGNOSIS — R1312 Dysphagia, oropharyngeal phase: Secondary | ICD-10-CM | POA: Diagnosis not present

## 2017-06-14 DIAGNOSIS — I10 Essential (primary) hypertension: Secondary | ICD-10-CM | POA: Insufficient documentation

## 2017-06-14 NOTE — Therapy (Signed)
[facility] [address] [place], [place], [address] Phone: [phone]   Fax:     Modified Barium Swallow  Patient Details  Name: Kristin Deleon MRN: [id number] Date of Birth: 04/22/1928 No Data Recorded  Encounter Date: 06/14/2017  End of Session - 06/14/17 1329    Visit Number  1    Number of Visits  1    Date for SLP Re-Evaluation  06/14/17    SLP Start Time  1245    SLP Stop Time   1330    SLP Time Calculation (min)  45 min    Activity Tolerance  Patient tolerated treatment well       Past Medical History:  Diagnosis Date  . 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 Diabetes mellitus without complication (HCC)    metformin-oral meds  . HOH (hard of hearing)    hearing aids/ bilateral  . Hypercholesteremia   . Hypertension    moderate/ controlled on meds  . Meniere syndrome   . PONV (postoperative nausea and vomiting)    difficulty waking up    Past Surgical History:  Procedure Laterality Date  . ABDOMINAL HYSTERECTOMY    . CATARACT EXTRACTION    . CATARACT EXTRACTION W/PHACO Right 09/19/2016   Procedure: CATARACT EXTRACTION PHACO AND INTRAOCULAR LENS PLACEMENT (IOC) Diabetic  Right eye;  Surgeon: Lockie Molahadwick Brasington, MD;  [facility];  Service: Ophthalmology;  Laterality: Right;  Diabetic-oral med  . CERVICAL FUSION     for fracture C1 and C2  . CHOLECYSTECTOMY    . ESOPHAGOGASTRODUODENOSCOPY (EGD) WITH PROPOFOL N/A 05/28/2017   Procedure: ESOPHAGOGASTRODUODENOSCOPY (EGD) WITH PROPOFOL;  Surgeon: Toledo, Boykin Nearingeodoro K, MD;  Location: [facility] ENDOSCOPY;  Service: Gastroenterology;  Laterality: N/A;  . JOINT REPLACEMENT Bilateral    knee and hip  . KNEE ARTHROSCOPY     multiple  . Morton's neuroma removed from foot    . ROTATOR CUFF REPAIR Right   . TONSILLECTOMY      There were no vitals filed for this visit.   Subjective: Patient behavior: (alertness, ability to follow instructions, etc.):  The patient is able to express her swallowing concerns and follow directions.  Chief complaint: sudden onset of difficulty swallowing, increased choking with liquids, and unintended weight loss.   Objective:  Radiological Procedure: A videoflouroscopic evaluation of oral-preparatory, reflex initiation, and pharyngeal phases of the swallow was performed; as well as a screening of the upper esophageal phase.  I. POSTURE: Upright in MBS chair, due to surgerical procedures, the patient has elevated shoulders and chin tuck  II. VIEW: Lateral  III. COMPENSATORY STRATEGIES: N/A  IV. BOLUSES ADMINISTERED:   Thin Liquid: 2 small, 3 rapid consecutive   Nectar-thick Liquid: 1 moderate   Honey-thick Liquid: DNT   Puree: 2 teaspoon presentations   Mechanical Soft: 1/4 graham cracker in applesauce  V. RESULTS OF EVALUATION: A. ORAL PREPARATORY PHASE: (The lips, tongue, and velum are observed for strength and coordination)       **Overall Severity Rating: Within normal limits  B. SWALLOW INITIATION/REFLEX: (The reflex is normal if "triggered" by the time the bolus reached the base of the tongue)  **Overall Severity Rating: Mild; triggers while falling from the valleculae to the pyriform sinuses  C. PHARYNGEAL PHASE: (Pharyngeal function is normal if the bolus shows rapid, smooth, and continuous transit through the pharynx and there is no pharyngeal residue after the swallow)  **Overall Severity Rating: Mild; decreased hyolaryngeal excursion,  decreased tongue base retraction, incomplete epiglottic inversion, and coating of residue throughout pharynx.    D. LARYNGEAL PENETRATION: (Material entering into the laryngeal inlet/vestibule but not aspirated) Transient with nectar-thick and thin liquid  E. ASPIRATION: None  F. ESOPHAGEAL PHASE: (Screening of the upper esophagus): cervical esophagus obscured by elevated shoulders  ASSESSMENT: This 89 year woman; with recent upper endoscopy positive  for "mid esophagus was moderately tortuous"; is presenting with mild oropharyngeal dysphagia characterized by delayed pharyngeal swallow initiation, decreased hyolaryngeal excursion, decreased tongue base retraction, incomplete epiglottic inversion, and coating of residue throughout pharynx.  There was transient laryngeal penetration with liquids and no observed tracheal aspiration.  The patient is not at significant risk for prandial aspiration and her complaints do not appear to be due to oropharyngeal dysphagia.  The patient may benefit from general esophageal dysmotility recommendations including: esophageal soft diet; small, frequent meals; stay upright after meals; eat upright; stay active and exercise; and maintain stringent oral care.  PLAN/RECOMMENDATIONS:    A. Diet: Soft   B. Swallowing Precautions: esophageal soft diet; small, frequent meals; stay upright after meals; eat upright; stay active and exercise; and maintain stringent oral care.   C. Recommended consultation to: follow up with GI and PCP as recommended   D. Therapy recommendations: ST is not indicated at this time   E. Results and recommendations were discussed with the patient and her daughter immediately following the study and the final report routed to the referring MD and patient's PCP.    Oropharyngeal dysphagia - Plan: DG OP Swallowing Func-Medicare/Speech Path, DG OP Swallowing Func-Medicare/Speech Path        Problem List There are no active problems to display for this patient.  Kristin Primrose, MS/CCC- SLP  [facility] 06/14/2017, 1:30 PM  [facility] [address] [place], [place], [address] Phone: [phone]   Fax:     Name: Kristin Deleon MRN: [id number] Date of Birth: Jul 22, 1927

## 2018-01-24 IMAGING — RF DG SWALLOWING FUNCTION
8 series · 13 of 24 positions shown · non-contrast
Comparison: None.

CLINICAL DATA: Dysphagia

EXAM:
MODIFIED BARIUM SWALLOW
TECHNIQUE: Different consistencies of barium were administered orally to the
patient by the Speech Pathologist. Imaging of the pharynx was
performed in the lateral projection.
FLUOROSCOPY TIME:  Fluoroscopy Time:  1 minute 0 seconds
Radiation Exposure Index (if provided by the fluoroscopic device):
3.5 mGy
Number of Acquired Spot Images: Cine images

[Series 1: run · 2 of 82 frames shown (1 of 8)]
[frame 13/82]
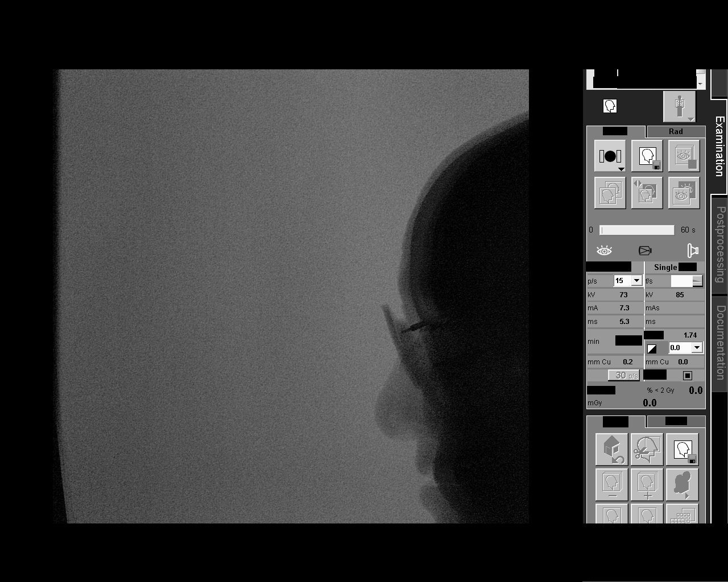
[frame 70/82]
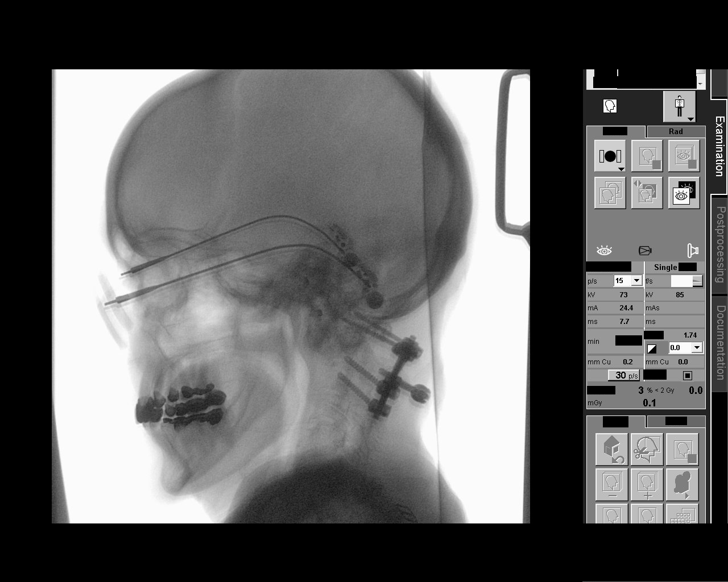

[Series 2: run · 1 of 464 frames shown (2 of 8)]
[frame 233/464]
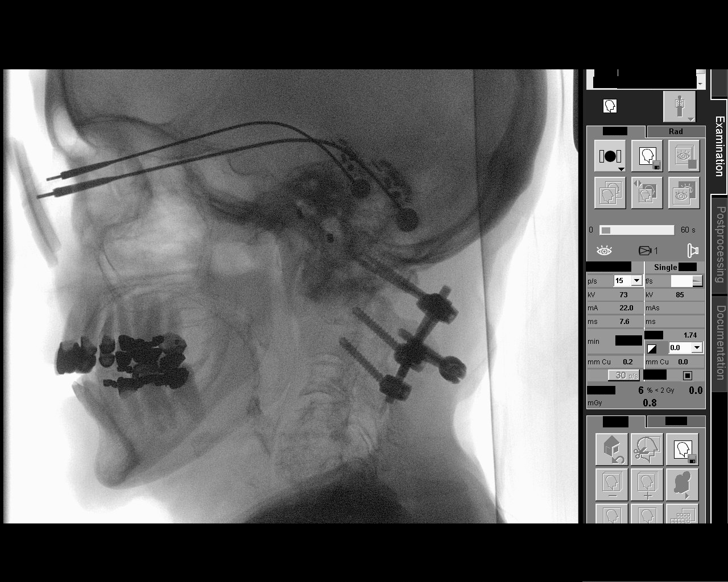

[Series 3: run · 2 of 422 frames shown (3 of 8)]
[frame 64/422]
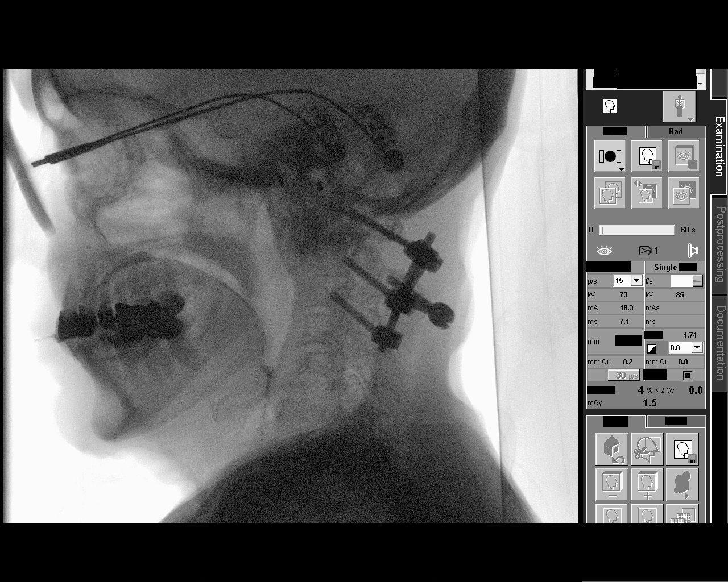
[frame 358/422]
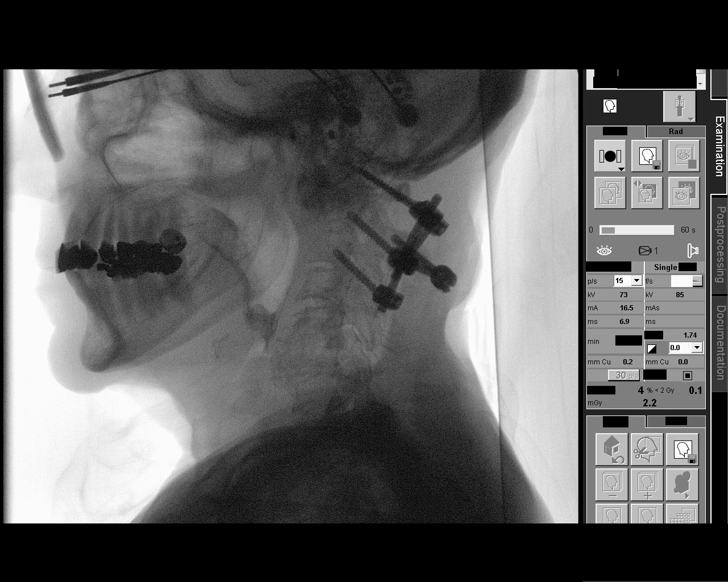

[Series 4: run · 1 of 250 frames shown (4 of 8)]
[frame 101/250]
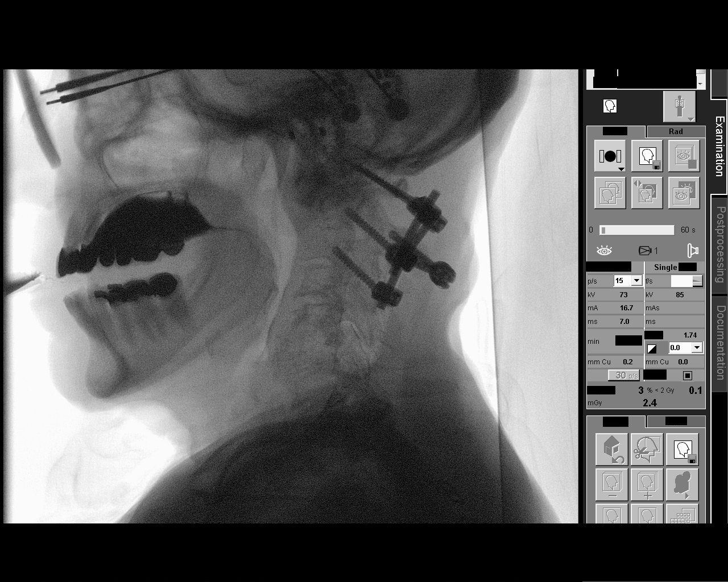

[Series 5: run · 2 of 16 frames shown (5 of 8)]
[frame 3/16]
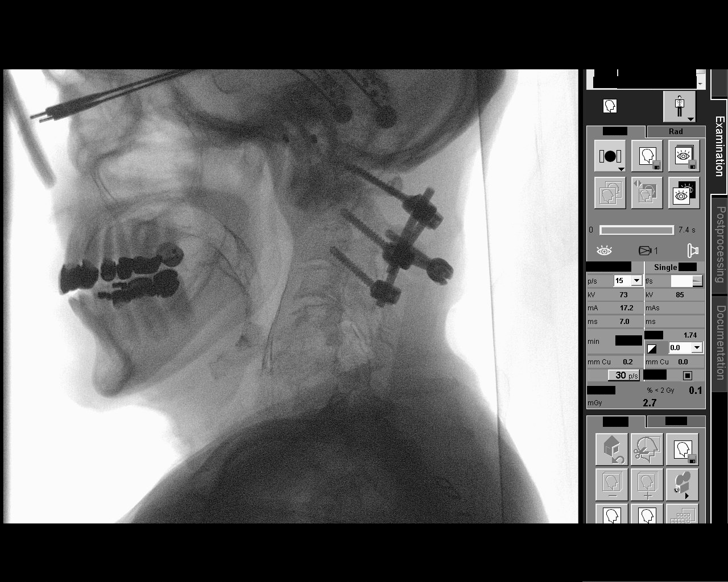
[frame 9/16]
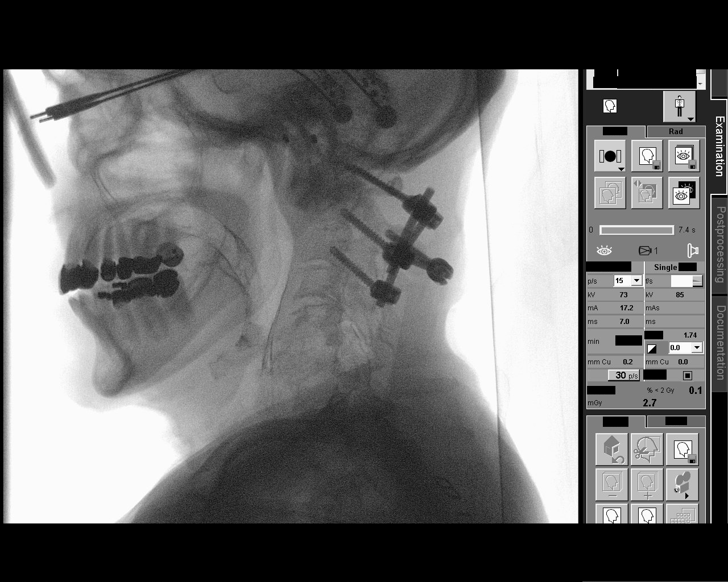

[Series 6: run · 2 of 278 frames shown (6 of 8)]
[frame 100/278]
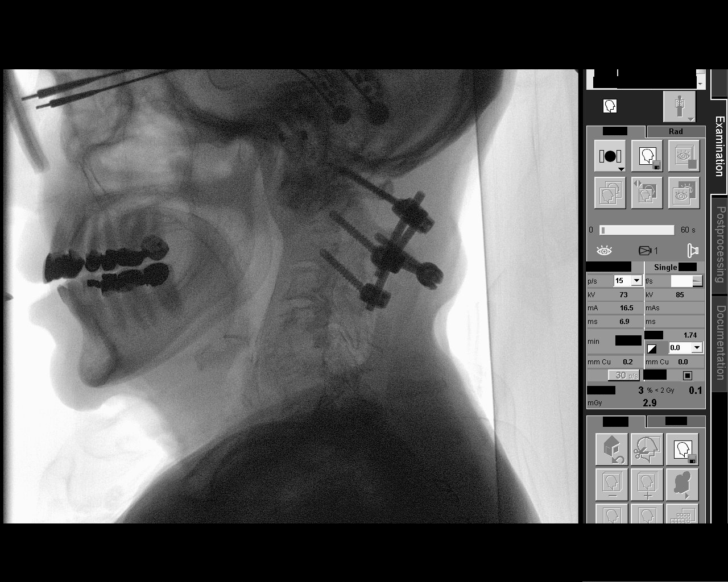
[frame 237/278]
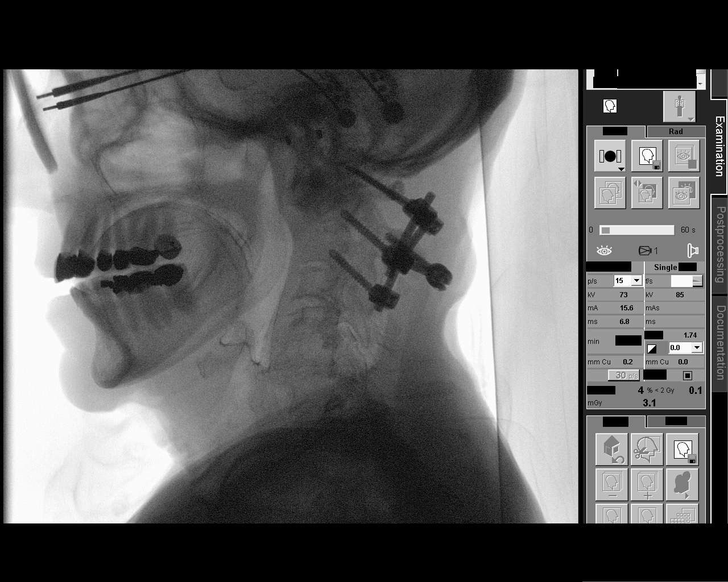

[Series 7: run · 1 of 164 frames shown (7 of 8)]
[frame 83/164]
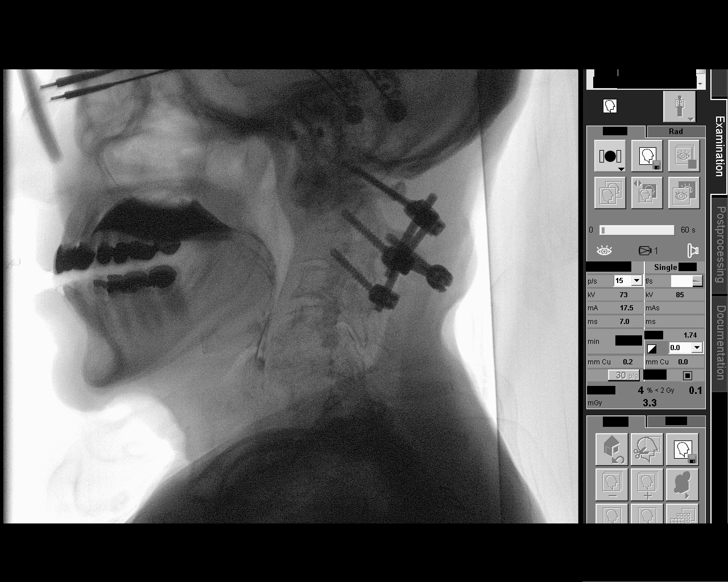

[Series 8: run · 2 of 250 frames shown (8 of 8)]
[frame 38/250]
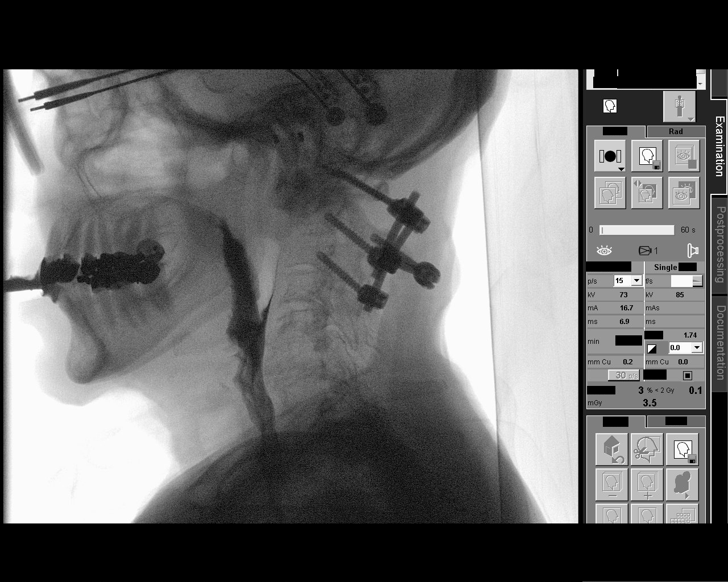
[frame 213/250]
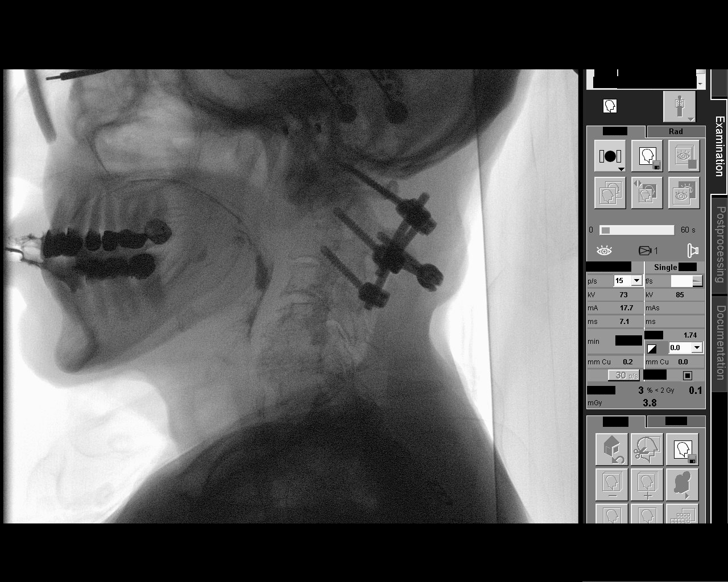

[13 of 24 positions shown; findings below may reference images not displayed]

FINDINGS: Thin liquid- flash penetration, otherwise negative.  No aspiration.

Nectar thick liquid- flash penetration, otherwise negative . No
aspiration.

Valeska?Javvier within normal limits

Valeska?Drey with cracker- within normal limits
IMPRESSION: Flash penetration with thin liquid and nectar, otherwise negative
exam. No aspiration identified.

Please refer to the Speech Pathologists report for complete details
and recommendations.

## 2019-04-20 ENCOUNTER — Other Ambulatory Visit: Payer: Self-pay

## 2019-04-20 DIAGNOSIS — Z20822 Contact with and (suspected) exposure to covid-19: Secondary | ICD-10-CM

## 2019-04-23 ENCOUNTER — Telehealth: Payer: Self-pay | Admitting: General Practice

## 2019-04-23 LAB — NOVEL CORONAVIRUS, NAA: SARS-CoV-2, NAA: NOT DETECTED

## 2019-04-23 NOTE — Telephone Encounter (Signed)
Gave patient negative covid test results Patient understood 

## 2019-12-31 DIAGNOSIS — M532X1 Spinal instabilities, occipito-atlanto-axial region: Secondary | ICD-10-CM | POA: Insufficient documentation

## 2020-02-17 ENCOUNTER — Encounter: Payer: Self-pay | Admitting: Podiatry

## 2020-02-17 ENCOUNTER — Other Ambulatory Visit: Payer: Self-pay

## 2020-02-17 ENCOUNTER — Ambulatory Visit (INDEPENDENT_AMBULATORY_CARE_PROVIDER_SITE_OTHER): Payer: MEDICARE | Admitting: Podiatry

## 2020-02-17 DIAGNOSIS — E785 Hyperlipidemia, unspecified: Secondary | ICD-10-CM | POA: Insufficient documentation

## 2020-02-17 DIAGNOSIS — H8109 Meniere's disease, unspecified ear: Secondary | ICD-10-CM | POA: Insufficient documentation

## 2020-02-17 DIAGNOSIS — L600 Ingrowing nail: Secondary | ICD-10-CM

## 2020-02-17 DIAGNOSIS — J309 Allergic rhinitis, unspecified: Secondary | ICD-10-CM | POA: Insufficient documentation

## 2020-02-17 DIAGNOSIS — I10 Essential (primary) hypertension: Secondary | ICD-10-CM | POA: Insufficient documentation

## 2020-02-17 DIAGNOSIS — Z780 Asymptomatic menopausal state: Secondary | ICD-10-CM | POA: Insufficient documentation

## 2020-02-17 DIAGNOSIS — E041 Nontoxic single thyroid nodule: Secondary | ICD-10-CM | POA: Insufficient documentation

## 2020-02-17 MED ORDER — NEOMYCIN-POLYMYXIN-HC 1 % OT SOLN: OTIC | 1 refills | Status: DC

## 2020-02-17 NOTE — Progress Notes (Signed)
Subjective:  Patient ID: Kristin Deleon, female    DOB: [birth year],  MRN: [id number] HPI Chief Complaint  Patient presents with   Nail Problem    Patient presents today for possible ingrown toenail right great toe medial border at tip of toe x 2-3 months.  She says "its sensitive to touch and tender when the sheets touch it"  She also c/o fungal nails left 5th toe, half of nail came off about 2 weeks ago, she denies any pain   Toe Pain    She c/o right 2nd toe starting to cross over right hallux.  She denies any pain     86 y.o. female presents with the above complaint.   ROS: Denies fever chills nausea vomiting muscle aches pains calf pain back pain chest pain shortness of breath.  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   Diabetes mellitus without complication (HCC)    metformin-oral meds   HOH (hard of hearing)    hearing aids/ bilateral   Hypercholesteremia    Hypertension    moderate/ controlled on meds   Meniere syndrome    PONV (postoperative nausea and vomiting)    difficulty waking up   Past Surgical History:  Procedure Laterality Date   ABDOMINAL HYSTERECTOMY     CATARACT EXTRACTION     CATARACT EXTRACTION W/PHACO Right 09/19/2016   Procedure: CATARACT EXTRACTION PHACO AND INTRAOCULAR LENS PLACEMENT (IOC) Diabetic  Right eye;  Surgeon: Lockie Mola, MD;  Location: [facility];  Service: Ophthalmology;  Laterality: Right;  Diabetic-oral med   CERVICAL FUSION     for fracture C1 and C2   CHOLECYSTECTOMY     ESOPHAGOGASTRODUODENOSCOPY (EGD) WITH PROPOFOL N/A 05/28/2017   Procedure: ESOPHAGOGASTRODUODENOSCOPY (EGD) WITH PROPOFOL;  Surgeon: Toledo, Boykin Nearing, MD;  Location: [facility];  Service: Gastroenterology;  Laterality: N/A;   JOINT REPLACEMENT Bilateral    knee and hip   KNEE ARTHROSCOPY     multiple   Morton's neuroma removed from foot     ROTATOR CUFF REPAIR Right    TONSILLECTOMY       Current Outpatient Medications:    amLODipine (NORVASC) 5 MG tablet, Take by mouth., Disp: , Rfl:    ezetimibe (ZETIA) 10 MG tablet, Take by mouth., Disp: , Rfl:    magnesium oxide (MAG-OX) 400 MG tablet, Take by mouth., Disp: , Rfl:    spironolactone (ALDACTONE) 25 MG tablet, Take by mouth., Disp: , Rfl:    Acetaminophen 325 MG CAPS, Take by mouth., Disp: , Rfl:    aspirin EC 81 MG tablet, Take 81 mg by mouth daily. am, Disp: , Rfl:    Calcium Carbonate-Vit D-Min (CALTRATE 600+D PLUS PO), Take by mouth. am, Disp: , Rfl:    Calcium Carbonate-Vitamin D 600-200 MG-UNIT TABS, Take 1 tablet by mouth daily., Disp: , Rfl:    Cholecalciferol (VITAMIN D3) 2000 units TABS, Take by mouth daily. am, Disp: , Rfl:    CINNAMON PO, Take 1,000 mg by mouth daily. am, Disp: , Rfl:    Coenzyme Q10 (COQ10) 200 MG CAPS, Take by mouth. Dinner, Disp: , Rfl:    Cranberry-Cholecalciferol 4200-500 MG-UNIT CAPS, Take by mouth daily. dinner, Disp: , Rfl:    diphenhydramine-acetaminophen (TYLENOL PM) 25-500 MG TABS tablet, Take 1 tablet by mouth at bedtime as needed., Disp: , Rfl:    ipratropium (ATROVENT) 0.03 % nasal spray, Place 2 sprays into both nostrils 2 (two) times daily., Disp: ,  Rfl:    Latanoprost (XALATAN OP), Apply 0.005 % to eye at bedtime. 1 drop per eye hour of sleep, Disp: , Rfl:    losartan (COZAAR) 50 MG tablet, Take by mouth. breakfast, Disp: , Rfl:    metFORMIN (GLUCOPHAGE) 1000 MG tablet, Take 1,000 mg by mouth 2 (two) times daily with a meal. Breakfast and dinner, Disp: , Rfl:    Multiple Vitamins-Minerals (CENTRUM SILVER 50+WOMEN PO), Take by mouth. am, Disp: , Rfl:    NEOMYCIN-POLYMYXIN-HYDROCORTISONE (CORTISPORIN) 1 % SOLN OTIC solution, Apply 1-2 drops to toe BID after soaking, Disp: 10 mL, Rfl: 1   triamcinolone cream (KENALOG) 0.1 %, Apply 1 application topically as needed. Apply externally to the affected area twice daily, Disp: , Rfl:    vitamin B-12 (CYANOCOBALAMIN)  500 MCG tablet, Take 500 mcg by mouth daily. dinner, Disp: , Rfl:   Allergies  Allergen Reactions   Amlodipine Swelling    Lower leg edema   Codeine Nausea And Vomiting   Fentanyl Nausea And Vomiting   Hydromorphone Hcl Nausea And Vomiting   Morphine Itching and Nausea And Vomiting    Other reaction(s): Vomiting/ feels like ants under skin   Nucynta [Tapentadol] Other (See Comments)    hallucinations   Statins     Muscle pain    Tegaderm Ag Mesh [Silver] Other (See Comments)    blisters   Tetracyclines & Related Rash   Review of Systems Objective:  There were no vitals filed for this visit.  General: Well developed, nourished, in no acute distress, alert and oriented x3   Dermatological: Skin is warm, dry and supple bilateral. Nails x 10 are well maintained; remaining integument appears unremarkable at this time. There are no open sores, no preulcerative lesions, no rash or signs of infection present.  Ingrown toenail paronychia abscess tibiofibular border the hallux right.  Vascular: Dorsalis Pedis artery and Posterior Tibial artery pedal pulses are 2/4 bilateral with immedate capillary fill time. Pedal hair growth present. No varicosities and no lower extremity edema present bilateral.   Neruologic: Grossly intact via light touch bilateral. Vibratory intact via tuning fork bilateral. Protective threshold with Semmes Wienstein monofilament intact to all pedal sites bilateral. Patellar and Achilles deep tendon reflexes 2+ bilateral. No Babinski or clonus noted bilateral.   Musculoskeletal: No gross boney pedal deformities bilateral. No pain, crepitus, or limitation noted with foot and ankle range of motion bilateral. Muscular strength 5/5 in all groups tested bilateral.  Gait: Unassisted, Nonantalgic.    Radiographs:  None taken  Assessment & Plan:   Assessment: Ingrown toenail tibial and fibular border of the hallux right.  Plan: Chemical matricectomy was  performed today after local anesthetic was administered.  She tolerated procedure well without complications.  There is no signs of infection.  She was given both oral and written wound instruction for the care and soaking of the toe.  She also received a prescription for Cortisporin Otic to be applied twice daily after soaking.  Follow-up with her in 2 weeks.     Anabell Swint T. [place], [place]

## 2020-02-17 NOTE — Patient Instructions (Signed)

## 2020-03-02 ENCOUNTER — Ambulatory Visit: Payer: MEDICARE | Admitting: Podiatry

## 2020-03-07 ENCOUNTER — Encounter: Payer: Self-pay | Admitting: Podiatry

## 2020-03-16 ENCOUNTER — Encounter: Payer: Self-pay | Admitting: Podiatry

## 2020-03-16 ENCOUNTER — Ambulatory Visit (INDEPENDENT_AMBULATORY_CARE_PROVIDER_SITE_OTHER): Payer: MEDICARE | Admitting: Podiatry

## 2020-03-16 ENCOUNTER — Other Ambulatory Visit: Payer: Self-pay

## 2020-03-16 DIAGNOSIS — L03031 Cellulitis of right toe: Secondary | ICD-10-CM

## 2020-03-16 MED ORDER — CLINDAMYCIN HCL 150 MG PO CAPS: 150.0000 mg | ORAL_CAPSULE | Freq: Three times a day (TID) | ORAL | 1 refills | Status: DC

## 2020-03-16 NOTE — Progress Notes (Signed)
She presents today for follow-up of her matrixectomy hallux right.  States is still a little tender but all in all is doing better.  She states that it must be allergic to Betadine because it may be start itching.  Objective: Vital signs are stable alert oriented x3 the toe is mildly red mild drainage along the fibular border but no purulence no malodor.  Assessment: Mild paronychia status post matrixectomy.  Plan: This point start her on clindamycin Epson salt and warm water soaks continue the use of the Cortisporin otic covered in the daytime but leave open at bedtime I will follow-up with her in 2 weeks

## 2020-04-04 ENCOUNTER — Encounter: Payer: Self-pay | Admitting: Podiatry

## 2020-04-04 ENCOUNTER — Ambulatory Visit (INDEPENDENT_AMBULATORY_CARE_PROVIDER_SITE_OTHER): Payer: MEDICARE | Admitting: Podiatry

## 2020-04-04 ENCOUNTER — Other Ambulatory Visit: Payer: Self-pay

## 2020-04-04 DIAGNOSIS — L03031 Cellulitis of right toe: Secondary | ICD-10-CM

## 2020-04-04 NOTE — Progress Notes (Signed)
She presents today for follow-up of her paronychia hallux right.  She states that her second toe is starting to set up a little bit red the top of her shoes.  Objective: Vital signs are stable she is alert and oriented x3.  Pulses are palpable.  Hallux looks much better though there is still some serosanguineous drainage no purulence no malodor she does have a moderate bunion deformity with near overlapping second toe.  Assessment: Well-healing matrixectomy hallux right.  Hammertoe deformity right second.  Plan: I discussed the possible need for a extensor tenotomy to help lower that toe also did discuss possible continuation of soaking should the toe become any more tender or starting drainage.  I will follow-up with her as needed.

## 2022-01-15 ENCOUNTER — Other Ambulatory Visit: Payer: Self-pay | Admitting: Physician Assistant

## 2022-01-15 ENCOUNTER — Ambulatory Visit: Payer: MEDICARE

## 2022-01-15 ENCOUNTER — Ambulatory Visit
Admission: RE | Admit: 2022-01-15 | Discharge: 2022-01-15 | Disposition: A | Discharge status: Home / Self Care | Payer: MEDICARE | Source: Ambulatory Visit | Attending: Physician Assistant | Admitting: Physician Assistant

## 2022-01-15 ENCOUNTER — Other Ambulatory Visit (HOSPITAL_COMMUNITY): Payer: Self-pay | Admitting: Physician Assistant

## 2022-01-15 DIAGNOSIS — M79662 Pain in left lower leg: Secondary | ICD-10-CM | POA: Insufficient documentation

## 2022-01-25 DIAGNOSIS — R4189 Other symptoms and signs involving cognitive functions and awareness: Secondary | ICD-10-CM | POA: Insufficient documentation

## 2022-01-25 DIAGNOSIS — T8454XD 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, subsequent encounter: Secondary | ICD-10-CM | POA: Insufficient documentation

## 2022-01-29 DIAGNOSIS — E785 Hyperlipidemia, unspecified: Secondary | ICD-10-CM | POA: Diagnosis not present

## 2022-01-29 DIAGNOSIS — E1159 Type 2 diabetes mellitus with other circulatory complications: Secondary | ICD-10-CM | POA: Diagnosis not present

## 2022-01-29 DIAGNOSIS — I10 Essential (primary) hypertension: Secondary | ICD-10-CM | POA: Diagnosis not present

## 2022-01-29 DIAGNOSIS — T8454XA 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, initial encounter: Secondary | ICD-10-CM | POA: Diagnosis not present

## 2022-01-29 DIAGNOSIS — J309 Allergic rhinitis, unspecified: Secondary | ICD-10-CM

## 2022-06-18 ENCOUNTER — Emergency Department: Payer: MEDICARE

## 2022-06-18 ENCOUNTER — Other Ambulatory Visit: Payer: Self-pay

## 2022-06-18 ENCOUNTER — Emergency Department
Admission: EM | Admit: 2022-06-18 | Discharge: 2022-06-19 | Disposition: A | Discharge status: Home / Self Care | Payer: MEDICARE | Attending: Emergency Medicine | Admitting: Emergency Medicine

## 2022-06-18 DIAGNOSIS — W19XXXA Unspecified fall, initial encounter: Secondary | ICD-10-CM

## 2022-06-18 DIAGNOSIS — S0990XA Unspecified injury of head, initial encounter: Secondary | ICD-10-CM | POA: Diagnosis present

## 2022-06-18 DIAGNOSIS — W01198A Fall on same level from slipping, tripping and stumbling with subsequent striking against other object, initial encounter: Secondary | ICD-10-CM | POA: Diagnosis not present

## 2022-06-18 DIAGNOSIS — S0001XA Abrasion of scalp, initial encounter: Secondary | ICD-10-CM

## 2022-06-18 NOTE — ED Triage Notes (Signed)
Pt comes via EMS from [facility] with c/o fall. Pt slipped on rug and fell hitting left side of head on dresser. No loc or thinners. Bleeding controlled.

## 2022-06-18 NOTE — ED Notes (Signed)
Pt refused any ct scans and states stitch me up and send me home

## 2022-06-18 NOTE — ED Provider Notes (Signed)
[facility] Provider Note   Event Date/Time   First MD Initiated Contact with Patient 06/18/22 2115     (approximate) History  Fall  HPI Kristin Deleon is a 89 y.o. female who presents via EMS from her long-term care facility after a mechanical fall from standing striking the left aspect of her occiput on the ground.  Patient denies any loss of consciousness or blood thinner use.  Patient has not had any nausea, subsequent loss of consciousness, or generalized weakness since this injury.  Patient states that she is up-to-date on her tetanus vaccination with the last vaccination being 2 years prior to arrival ROS: Patient currently denies any vision changes, tinnitus, difficulty speaking, facial droop, sore throat, chest pain, shortness of breath, abdominal pain, nausea/vomiting/diarrhea, dysuria, or weakness/numbness/paresthesias in any extremity   Physical Exam  Triage Vital Signs: ED Triage Vitals  Enc Vitals Group     BP 06/18/22 1729 133/82     Pulse Rate 06/18/22 1729 85     Resp 06/18/22 1729 18     Temp 06/18/22 1729 97.8 F (36.6 C)     Temp Source 06/18/22 1729 Oral     SpO2 06/18/22 1729 99 %     Weight 06/18/22 1730 125 lb (56.7 kg)     Height 06/18/22 1730 5\' 2"  (1.575 m)     Head Circumference --      Peak Flow --      Pain Score 06/18/22 1646 4     Pain Loc --      Pain Edu? --      Excl. in GC? --    Most recent vital signs: Vitals:   06/18/22 1729  BP: 133/82  Pulse: 85  Resp: 18  Temp: 97.8 F (36.6 C)  SpO2: 99%   General: Awake, oriented x4. CV:  Good peripheral perfusion.  Resp:  Normal effort.  Abd:  No distention.  Other:  Elderly Caucasian female laying in bed in no acute distress.  There is a 1.5 cm superficial abrasion to the left lateral occiput ED Results / Procedures / Treatments  RADIOLOGY ED MD interpretation: CT of the head without contrast interpreted by me shows no evidence of acute abnormalities including  no intracerebral hemorrhage, obvious masses, or significant edema  CT of the cervical spine interpreted by me does not show any evidence of acute abnormalities including no acute fracture, malalignment, height loss, or dislocation -Agree with radiology assessment Official radiology report(s): CT Head Wo Contrast  Result Date: 06/18/2022 CLINICAL DATA:  Slipped on a rug and fell hitting left side of head EXAM: CT HEAD WITHOUT CONTRAST CT CERVICAL SPINE WITHOUT CONTRAST TECHNIQUE: Multidetector CT imaging of the head and cervical spine was performed following the standard protocol without intravenous contrast. Multiplanar CT image reconstructions of the cervical spine were also generated. RADIATION DOSE REDUCTION: This exam was performed according to the departmental dose-optimization program which includes automated exposure control, adjustment of the mA and/or kV according to patient size and/or use of iterative reconstruction technique. COMPARISON:  CT head 11/22/2010; cervical spine radiographs 05/22/2017 FINDINGS: CT HEAD FINDINGS Brain: No intracranial hemorrhage, mass effect, or evidence of acute infarct. No hydrocephalus. No extra-axial fluid collection. Generalized cerebral atrophy. Ill-defined hypoattenuation within the cerebral white matter is nonspecific but consistent with chronic small vessel ischemic disease. Vascular: No hyperdense vessel. Intracranial arterial calcification. Skull: No fracture or focal lesion. Sinuses/Orbits: No acute finding. Other: None. CT CERVICAL SPINE FINDINGS Alignment: No traumatic malalignment.  Alignment appears similar to radiographs 05/22/2017 Skull base and vertebrae: No acute fracture. No primary bone lesion or focal pathologic process. Soft tissues and spinal canal: No prevertebral fluid or swelling. No visible canal hematoma. Disc levels: Advanced degenerative change at the atlantoaxial joint with widening of the atlantoaxial interval secondary to osteophytes.  There is mild-moderate spinal canal narrowing at this level. Multilevel advanced spondylosis and disc space height loss throughout the cervical spine. Multilevel cervical facet arthropathy. Degenerative findings are similar to slightly progressed from radiographs 05/22/2017. No high-grade spinal canal narrowing. Multilevel advanced neural foraminal narrowing secondary to uncovertebral spurring and facet arthropathy. Posterior fusion of the C1. The most superior left screw goes through the posterior left arch of C1 with the tip abutting the left thecal sac. This is of indeterminate significance and is favored chronic. Upper chest: 7 mm nodule in the anterior left upper lobe (4/71). Other: Aortic and carotid atherosclerotic calcification. IMPRESSION: 1. No acute intracranial abnormality. 2. No cervical spine fracture.  Advanced multilevel spondylosis. 3. Posterior fusion of C1-C3. The left C1 screw passes through the posterior left arch of C1 with the tip abutting the left thecal sac not within bone. This is favored chronic and of indeterminate significance. 4. Mild-moderate spinal canal narrowing at the level of the dens secondary to widening of the atlantodental interval from hypertrophic osteophytes. 5. 7 mm nodule in the anterior left upper lobe. Non-contrast chest CT at 6-12 months is recommended. If the nodule is stable at time of repeat CT, then future CT at 18-24 months (from today's scan) is considered optional for low-risk patients, but is recommended for high-risk patients. This recommendation follows the consensus statement: Guidelines for Management of Incidental Pulmonary Nodules Detected on CT Images: From the Fleischner Society 2017; Radiology 2017; 284:228-243. Electronically Signed   By: Placido Sou M.D.   On: 06/18/2022 21:47   CT Cervical Spine Wo Contrast  Result Date: 06/18/2022 CLINICAL DATA:  Slipped on a rug and fell hitting left side of head EXAM: CT HEAD WITHOUT CONTRAST CT CERVICAL  SPINE WITHOUT CONTRAST TECHNIQUE: Multidetector CT imaging of the head and cervical spine was performed following the standard protocol without intravenous contrast. Multiplanar CT image reconstructions of the cervical spine were also generated. RADIATION DOSE REDUCTION: This exam was performed according to the departmental dose-optimization program which includes automated exposure control, adjustment of the mA and/or kV according to patient size and/or use of iterative reconstruction technique. COMPARISON:  CT head 11/22/2010; cervical spine radiographs 05/22/2017 FINDINGS: CT HEAD FINDINGS Brain: No intracranial hemorrhage, mass effect, or evidence of acute infarct. No hydrocephalus. No extra-axial fluid collection. Generalized cerebral atrophy. Ill-defined hypoattenuation within the cerebral white matter is nonspecific but consistent with chronic small vessel ischemic disease. Vascular: No hyperdense vessel. Intracranial arterial calcification. Skull: No fracture or focal lesion. Sinuses/Orbits: No acute finding. Other: None. CT CERVICAL SPINE FINDINGS Alignment: No traumatic malalignment. Alignment appears similar to radiographs 05/22/2017 Skull base and vertebrae: No acute fracture. No primary bone lesion or focal pathologic process. Soft tissues and spinal canal: No prevertebral fluid or swelling. No visible canal hematoma. Disc levels: Advanced degenerative change at the atlantoaxial joint with widening of the atlantoaxial interval secondary to osteophytes. There is mild-moderate spinal canal narrowing at this level. Multilevel advanced spondylosis and disc space height loss throughout the cervical spine. Multilevel cervical facet arthropathy. Degenerative findings are similar to slightly progressed from radiographs 05/22/2017. No high-grade spinal canal narrowing. Multilevel advanced neural foraminal narrowing secondary to uncovertebral spurring and facet  arthropathy. Posterior fusion of the C1. The most  superior left screw goes through the posterior left arch of C1 with the tip abutting the left thecal sac. This is of indeterminate significance and is favored chronic. Upper chest: 7 mm nodule in the anterior left upper lobe (4/71). Other: Aortic and carotid atherosclerotic calcification. IMPRESSION: 1. No acute intracranial abnormality. 2. No cervical spine fracture.  Advanced multilevel spondylosis. 3. Posterior fusion of C1-C3. The left C1 screw passes through the posterior left arch of C1 with the tip abutting the left thecal sac not within bone. This is favored chronic and of indeterminate significance. 4. Mild-moderate spinal canal narrowing at the level of the dens secondary to widening of the atlantodental interval from hypertrophic osteophytes. 5. 7 mm nodule in the anterior left upper lobe. Non-contrast chest CT at 6-12 months is recommended. If the nodule is stable at time of repeat CT, then future CT at 18-24 months (from today's scan) is considered optional for low-risk patients, but is recommended for high-risk patients. This recommendation follows the consensus statement: Guidelines for Management of Incidental Pulmonary Nodules Detected on CT Images: From the Fleischner Society 2017; Radiology 2017; 284:228-243. Electronically Signed   By: Minerva Fester M.D.   On: 06/18/2022 21:47   PROCEDURES: Critical Care performed: No Procedures MEDICATIONS ORDERED IN ED: Medications - No data to display IMPRESSION / MDM / ASSESSMENT AND PLAN / ED COURSE  I reviewed the triage vital signs and the nursing notes.                             Patient's presentation is most consistent with acute presentation with potential threat to life or bodily function. Presenting after a fall that occurred just prior to arrival, resulting in injury to the occipital scalp. The mechanism of injury was a mechanical ground level fall without syncope or near-syncope. The current level of pain is moderate. There was no  loss of consciousness, confusion, seizure, or memory impairment. There is not a laceration associated with the injury. Denies neck pain. The patient does not take blood thinner medications. Denies vomiting, numbness/weakness, fever  Dispo: Discharge with PCP follow-up       FINAL CLINICAL IMPRESSION(S) / ED DIAGNOSES   Final diagnoses:  Fall, initial encounter  Abrasion of scalp, initial encounter  Minor head injury, initial encounter   Rx / DC Orders   ED Discharge Orders     None      Note:  This document was prepared using Dragon voice recognition software and may include unintentional dictation errors.   Merwyn Katos, MD 06/18/22 [phone]

## 2022-06-19 NOTE — ED Notes (Signed)
Received call from [facility] that pt was safely in room/ [place] made aware

## 2022-06-19 NOTE — ED Notes (Signed)
In room to Dc pt, Pt not found in room. No transport necessity on file for pt. Charge RN made aware. This nurse placed call to [facility], and informed by [facility] that pt has not arrived back to facility to their knowledge. Attempts to contact pt at number listed unsuccessful.

## 2022-10-18 LAB — HM DIABETES EYE EXAM

## 2022-12-21 ENCOUNTER — Ambulatory Visit: Payer: MEDICARE | Admitting: Student

## 2022-12-21 ENCOUNTER — Encounter: Payer: Self-pay | Admitting: Student

## 2022-12-21 VITALS — BP 118/68 | HR 67 | Temp 98.0°F | Ht 62.0 in | Wt 128.0 lb

## 2022-12-21 DIAGNOSIS — E1165 Type 2 diabetes mellitus with hyperglycemia: Secondary | ICD-10-CM | POA: Diagnosis not present

## 2022-12-21 DIAGNOSIS — Z96659 Presence of unspecified artificial knee joint: Secondary | ICD-10-CM

## 2022-12-21 DIAGNOSIS — M532X1 Spinal instabilities, occipito-atlanto-axial region: Secondary | ICD-10-CM

## 2022-12-21 DIAGNOSIS — T84063D Wear of articular bearing surface of internal prosthetic left knee joint, subsequent encounter: Secondary | ICD-10-CM

## 2022-12-21 DIAGNOSIS — I10 Essential (primary) hypertension: Secondary | ICD-10-CM | POA: Diagnosis not present

## 2022-12-21 DIAGNOSIS — T84068D Wear of articular bearing surface of other internal prosthetic joint, subsequent encounter: Secondary | ICD-10-CM | POA: Diagnosis not present

## 2022-12-21 DIAGNOSIS — G301 Alzheimer's disease with late onset: Secondary | ICD-10-CM

## 2022-12-21 DIAGNOSIS — F02A0 Dementia in other diseases classified elsewhere, mild, without behavioral disturbance, psychotic disturbance, mood disturbance, and anxiety: Secondary | ICD-10-CM | POA: Insufficient documentation

## 2022-12-21 NOTE — Progress Notes (Signed)
Location:  [facility].   Provider: Dr. Earnestine Mealing  Code Status: DNR  Goals of Care:     12/21/2022   12:55 PM  Advanced Directives  Does Patient Have a Medical Advance Directive? Yes  Type of Estate agent of [place];Out of facility DNR (pink MOST or yellow form);Living will  Does patient want to make changes to medical advance directive? No - Patient declined  Copy of Healthcare Power of Attorney in Chart? No - copy requested     Chief Complaint  Patient presents with   Establish Care    NP to Establish Care.    Quality Metric Gaps    To discuss need for Tdap, Covid, A1C, Foot exam and Dexa    HPI: Patient is a 89 y.o. female seen today to Establish.   Multimorbidity-  Diabetes - Well controlled, last A1c 7.4 on 08/2022  Hypertension - well-controlled.   Medications - Uncertain what she takes, didn't bring a list. Will bring one another time.   Mobility - she had a fall earlier this year. A few months ago was a [facility] had surgery on her knee and was admitted for 5 days for a septic knee joint. She wears a neck brace because of neck instability.   Memory- She knows there are things that she can't bring into mind. It's not anything that worries her because she doesn't have a lot of responsibility. She has an aid 2 mornings a week for 2 hours and drives her to the supermarket.   Matters most: Strength to take care of herself. "I would rather be dead than confined to a bed."    She is originally from [place]. Daughter went to [place] and lives in [place]. She had a home in [place] until 3 years ago. [place] Son in [place] (Has Prostate Cancer and has surgery in September) and grandson in [place]. [place]. A few months ago she stopped driving -- since her license expired. She gets her medications at [facility]. She cooks and even bakes. 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   Diabetes mellitus without complication (HCC)    metformin-oral meds   HOH (hard of hearing)    hearing aids/ bilateral   Hypercholesteremia    Hypertension    moderate/ controlled on meds   Meniere syndrome    PONV (postoperative nausea and vomiting)    difficulty waking up    Past Surgical History:  Procedure Laterality Date   ABDOMINAL HYSTERECTOMY     CATARACT EXTRACTION     CATARACT EXTRACTION W/PHACO Right 09/19/2016   Procedure: CATARACT EXTRACTION PHACO AND INTRAOCULAR LENS PLACEMENT (IOC) Diabetic  Right eye;  Surgeon: Lockie Mola, MD;  Location: [facility];  Service: Ophthalmology;  Laterality: Right;  Diabetic-oral med   CERVICAL FUSION     for fracture C1 and C2   CHOLECYSTECTOMY     ESOPHAGOGASTRODUODENOSCOPY (EGD) WITH PROPOFOL N/A 05/28/2017   Procedure: ESOPHAGOGASTRODUODENOSCOPY (EGD) WITH PROPOFOL;  Surgeon: Toledo, Boykin Nearing, MD;  Location: [facility];  Service: Gastroenterology;  Laterality: N/A;   JOINT REPLACEMENT Bilateral    knee and hip   KNEE ARTHROSCOPY     multiple   Morton's neuroma removed from foot     ROTATOR CUFF REPAIR Right    TONSILLECTOMY  Allergies  Allergen Reactions   Amlodipine Swelling    Lower leg edema   Codeine Nausea And Vomiting   Fentanyl Nausea And Vomiting   Hydromorphone Hcl Nausea And Vomiting   Morphine Itching and Nausea And Vomiting    Other reaction(s): Vomiting/ feels like ants under skin   Nucynta [Tapentadol] Other (See Comments)    hallucinations   Statins     Muscle pain    Tegaderm Ag Mesh [Silver] Other (See Comments)    blisters   Tetracyclines & Related Rash    Outpatient Encounter Medications as of 12/21/2022  Medication Sig   Acetaminophen 325 MG CAPS Take by mouth.   amLODipine (NORVASC) 5 MG tablet Take by mouth.   aspirin EC 81 MG tablet Take 81 mg by mouth daily. am   Calcium Carbonate-Vit  D-Min (CALTRATE 600+D PLUS PO) Take by mouth. am   Calcium Carbonate-Vitamin D 600-200 MG-UNIT TABS Take 1 tablet by mouth daily.   Cholecalciferol (VITAMIN D3) 2000 units TABS Take by mouth daily. am   CINNAMON PO Take 1,000 mg by mouth daily. am   Coenzyme Q10 (COQ10) 200 MG CAPS Take by mouth. Dinner   Cranberry-Cholecalciferol 4200-500 MG-UNIT CAPS Take by mouth daily. dinner   diphenhydramine-acetaminophen (TYLENOL PM) 25-500 MG TABS tablet Take 1 tablet by mouth at bedtime as needed.   ezetimibe (ZETIA) 10 MG tablet Take by mouth.   ipratropium (ATROVENT) 0.03 % nasal spray Place 2 sprays into both nostrils 2 (two) times daily.   Latanoprost (XALATAN OP) Apply 0.005 % to eye at bedtime. 1 drop per eye hour of sleep   losartan (COZAAR) 50 MG tablet Take by mouth. breakfast   metFORMIN (GLUCOPHAGE) 1000 MG tablet Take 1,000 mg by mouth 2 (two) times daily with a meal. Breakfast and dinner   Multiple Vitamins-Minerals (CENTRUM SILVER 50+WOMEN PO) Take by mouth. am   NEOMYCIN-POLYMYXIN-HYDROCORTISONE (CORTISPORIN) 1 % SOLN OTIC solution Apply 1-2 drops to toe BID after soaking   triamcinolone cream (KENALOG) 0.1 % Apply 1 application topically as needed. Apply externally to the affected area twice daily   vitamin B-12 (CYANOCOBALAMIN) 500 MCG tablet Take 500 mcg by mouth daily. dinner   spironolactone (ALDACTONE) 25 MG tablet Take by mouth.   [DISCONTINUED] clindamycin (CLEOCIN) 150 MG capsule Take 1 capsule (150 mg total) by mouth 3 (three) times daily.   No facility-administered encounter medications on file as of 12/21/2022.    Review of Systems:  Review of Systems  Health Maintenance  Topic Date Due   HEMOGLOBIN A1C  Never done   FOOT EXAM  Never done   OPHTHALMOLOGY EXAM  Never done   DEXA SCAN  Never done   DTaP/Tdap/Td (2 - Td or Tdap) 05/12/2020   COVID-19 Vaccine (7 - 2023-24 season) 10/17/2022   INFLUENZA VACCINE  01/10/2023   Medicare Annual Wellness (AWV)  08/22/2023    Pneumonia Vaccine 84+ Years old  Completed   Zoster Vaccines- Shingrix  Completed   HPV VACCINES  Aged Out    Physical Exam: Vitals:   12/21/22 1251  BP: 118/68  Pulse: 67  Temp: 98 F (36.7 C)  SpO2: 96%  Weight: 128 lb (58.1 kg)  Height: 5\' 2"  (1.575 m)   Body mass index is 23.41 kg/m. Physical Exam Vitals reviewed.  Constitutional:      Appearance: Normal appearance.  HENT:     Right Ear: Tympanic membrane normal.     Left Ear: Tympanic membrane normal.  Cardiovascular:  Rate and Rhythm: Normal rate and regular rhythm.     Pulses: Normal pulses.          Dorsalis pedis pulses are 2+ on the right side and 2+ on the left side.       Posterior tibial pulses are 2+ on the right side and 2+ on the left side.     Heart sounds: Normal heart sounds.  Pulmonary:     Effort: Pulmonary effort is normal.     Breath sounds: Normal breath sounds.  Abdominal:     General: Abdomen is flat. Bowel sounds are normal.     Palpations: Abdomen is soft.  Musculoskeletal:     Right foot: Normal range of motion. No deformity, bunion, Charcot foot or foot drop.     Left foot: Normal range of motion. No deformity, bunion, Charcot foot or foot drop.  Feet:     Right foot:     Protective Sensation: 5 sites tested.  5 sites sensed.     Skin integrity: Skin integrity normal.     Toenail Condition: Right toenails are normal.     Left foot:     Protective Sensation: 5 sites tested.  5 sites sensed.     Skin integrity: Skin integrity normal.     Toenail Condition: Left toenails are abnormally thick.  Neurological:     General: No focal deficit present.     Mental Status: She is alert. Mental status is at baseline.     Comments: SLUMS 16/30  Psychiatric:        Mood and Affect: Mood normal.        Behavior: Behavior normal.     Labs reviewed: Basic Metabolic Panel: No results for input(s): "NA", "K", "CL", "CO2", "GLUCOSE", "BUN", "CREATININE", "CALCIUM", "MG", "PHOS", "TSH" in the  last 8760 hours. Liver Function Tests: No results for input(s): "AST", "ALT", "ALKPHOS", "BILITOT", "PROT", "ALBUMIN" in the last 8760 hours. No results for input(s): "LIPASE", "AMYLASE" in the last 8760 hours. No results for input(s): "AMMONIA" in the last 8760 hours. CBC: No results for input(s): "WBC", "NEUTROABS", "HGB", "HCT", "MCV", "PLT" in the last 8760 hours. Lipid Panel: No results for input(s): "CHOL", "HDL", "LDLCALC", "TRIG", "CHOLHDL", "LDLDIRECT" in the last 8760 hours. No results found for: "HGBA1C"  Procedures since last visit: No results found.  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 Type 2 diabetes mellitus with hyperglycemia, without long-term current use of insulin (HCC) Patient takes metformin for DM. Most recent 7.4, will repeat at this time.   2. Hypertension, unspecified type BP well-controlled. Unclear what medications she is currently taking. Will verify medications with lab results next week.   3. 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 Knee infection of the left 01/2022. Doing well. One fall in the last year hit occiput, Continue medication  5. Atlantoaxial instability Patient had a history of c-spine fusion and has a "loose screw" present. Wears a neck brace often in  vehicles and with exercise out of precaution.   6. Dementia Patient scored a 16 on [place] today. Patient is independently ambulatory. She is continent of stool an urine. She has an aid coming to the home 2x per week who also helps with groceries. Weight is stable. Normal BMI. Social work aware. Will continue supportive care. FAST stage 4. F/u 3 months.    Labs/tests ordered:  * No order type specified * Next appt:  Visit date not found  I spent greater than 60 minutes for the care of this patient in face to face time, chart review, clinical documentation, patient education.

## 2022-12-21 NOTE — Patient Instructions (Signed)
It was great to meet you!  The phlebotomist will come to your home on Monday morning to collect labs at 8AM.   Please bring a list of your medications with you to your next visit.

## 2022-12-24 LAB — COMPLETE METABOLIC PANEL WITH GFR
BUN/Creatinine Ratio: 19 (calc) (ref 6–22)
BUN: 20 mg/dL (ref 7–25)
Chloride: 102 mmol/L (ref 98–110)
Creat: 1.07 mg/dL — ABNORMAL HIGH (ref 0.60–0.95)
Glucose, Bld: 215 mg/dL — ABNORMAL HIGH (ref 65–99)
Potassium: 4.3 mmol/L (ref 3.5–5.3)
Total Protein: 6.4 g/dL (ref 6.1–8.1)

## 2022-12-24 LAB — CBC WITH DIFFERENTIAL/PLATELET
Absolute Monocytes: 482 cells/uL (ref 200–950)
HCT: 37.6 % (ref 35.0–45.0)
MCH: 29.8 pg (ref 27.0–33.0)
MCHC: 32.2 g/dL (ref 32.0–36.0)
MCV: 92.6 fL (ref 80.0–100.0)
MPV: 10.6 fL (ref 7.5–12.5)
Monocytes Relative: 6.1 %
Neutrophils Relative %: 50.3 %
RBC: 4.06 10*6/uL (ref 3.80–5.10)
WBC: 7.9 10*3/uL (ref 3.8–10.8)

## 2022-12-25 LAB — CBC WITH DIFFERENTIAL/PLATELET
Basophils Absolute: 63 cells/uL (ref 0–200)
Basophils Relative: 0.8 %
Eosinophils Absolute: 150 cells/uL (ref 15–500)
Eosinophils Relative: 1.9 %
Hemoglobin: 12.1 g/dL (ref 11.7–15.5)
Lymphs Abs: 3231 cells/uL (ref 850–3900)
Neutro Abs: 3974 cells/uL (ref 1500–7800)
Platelets: 213 10*3/uL (ref 140–400)
RDW: 12.8 % (ref 11.0–15.0)
Total Lymphocyte: 40.9 %

## 2022-12-25 LAB — COMPLETE METABOLIC PANEL WITH GFR
AG Ratio: 1.9 (calc) (ref 1.0–2.5)
ALT: 8 U/L (ref 6–29)
AST: 14 U/L (ref 10–35)
Albumin: 4.2 g/dL (ref 3.6–5.1)
Alkaline phosphatase (APISO): 57 U/L (ref 37–153)
CO2: 30 mmol/L (ref 20–32)
Calcium: 9.7 mg/dL (ref 8.6–10.4)
Globulin: 2.2 g/dL (calc) (ref 1.9–3.7)
Sodium: 140 mmol/L (ref 135–146)
Total Bilirubin: 0.5 mg/dL (ref 0.2–1.2)
eGFR: 48 mL/min/{1.73_m2} — ABNORMAL LOW (ref >=60)

## 2022-12-25 LAB — HEMOGLOBIN A1C
Hgb A1c MFr Bld: 7.8 % of total Hgb — ABNORMAL HIGH (ref <5.7)
Mean Plasma Glucose: 177 mg/dL
eAG (mmol/L): 9.8 mmol/L

## 2022-12-26 ENCOUNTER — Telehealth: Payer: Self-pay | Admitting: Student

## 2022-12-26 NOTE — Telephone Encounter (Signed)
 Patient Notified and agreed. 

## 2022-12-26 NOTE — Telephone Encounter (Signed)
Please notify patient her kidney function is stable and electrolytes normal. Her A1c is increased from previously - 7.8 today and was 7.4 previously. Please be mindful of the volume of sugary foods - cakes, cookies, breads -- that you eat to prevent need of medication adjustments. At this time, continue metformin 100 mg in the morning and evening.

## 2023-01-18 ENCOUNTER — Other Ambulatory Visit: Payer: Self-pay

## 2023-01-18 ENCOUNTER — Emergency Department: Payer: MEDICARE

## 2023-01-18 ENCOUNTER — Ambulatory Visit
Admission: EM | Admit: 2023-01-18 | Discharge: 2023-01-18 | Disposition: A | Discharge status: Other Acute Inpatient Hospital | Payer: MEDICARE | Attending: Family Medicine | Admitting: Family Medicine

## 2023-01-18 ENCOUNTER — Emergency Department
Admission: EM | Admit: 2023-01-18 | Discharge: 2023-01-18 | Disposition: A | Discharge status: Home / Self Care | Payer: MEDICARE | Attending: Student in an Organized Health Care Education/Training Program | Admitting: Student in an Organized Health Care Education/Training Program

## 2023-01-18 DIAGNOSIS — E1165 Type 2 diabetes mellitus with hyperglycemia: Secondary | ICD-10-CM | POA: Diagnosis not present

## 2023-01-18 DIAGNOSIS — E119 Type 2 diabetes mellitus without complications: Secondary | ICD-10-CM | POA: Insufficient documentation

## 2023-01-18 DIAGNOSIS — R42 Dizziness and giddiness: Secondary | ICD-10-CM | POA: Diagnosis not present

## 2023-01-18 DIAGNOSIS — R9431 Abnormal electrocardiogram [ECG] [EKG]: Secondary | ICD-10-CM | POA: Insufficient documentation

## 2023-01-18 DIAGNOSIS — R000 Tachycardia, unspecified: Secondary | ICD-10-CM | POA: Insufficient documentation

## 2023-01-18 LAB — COMPREHENSIVE METABOLIC PANEL
ALT: 12 U/L (ref 0–44)
AST: 18 U/L (ref 15–41)
Albumin: 3.8 g/dL (ref 3.5–5.0)
Alkaline Phosphatase: 61 U/L (ref 38–126)
Anion gap: 9 (ref 5–15)
BUN: 19 mg/dL (ref 8–23)
CO2: 23 mmol/L (ref 22–32)
Calcium: 9.3 mg/dL (ref 8.9–10.3)
Chloride: 105 mmol/L (ref 98–111)
Creatinine, Ser: 1.03 mg/dL — ABNORMAL HIGH (ref 0.44–1.00)
GFR, Estimated: 51 mL/min — ABNORMAL LOW (ref >60)
Glucose, Bld: 194 mg/dL — ABNORMAL HIGH (ref 70–99)
Potassium: 3.7 mmol/L (ref 3.5–5.1)
Sodium: 137 mmol/L (ref 135–145)
Total Bilirubin: 0.9 mg/dL (ref 0.3–1.2)
Total Protein: 6.9 g/dL (ref 6.5–8.1)

## 2023-01-18 LAB — CBC WITH DIFFERENTIAL/PLATELET
Abs Immature Granulocytes: 0.02 10*3/uL (ref 0.00–0.07)
Basophils Absolute: 0.1 10*3/uL (ref 0.0–0.1)
Basophils Relative: 1 %
Eosinophils Absolute: 0.2 10*3/uL (ref 0.0–0.5)
Eosinophils Relative: 2 %
HCT: 38.6 % (ref 36.0–46.0)
Hemoglobin: 12.7 g/dL (ref 12.0–15.0)
Immature Granulocytes: 0 %
Lymphocytes Relative: 35 %
Lymphs Abs: 2.9 10*3/uL (ref 0.7–4.0)
MCH: 30.1 pg (ref 26.0–34.0)
MCHC: 32.9 g/dL (ref 30.0–36.0)
MCV: 91.5 fL (ref 80.0–100.0)
Monocytes Absolute: 0.6 10*3/uL (ref 0.1–1.0)
Monocytes Relative: 7 %
Neutro Abs: 4.6 10*3/uL (ref 1.7–7.7)
Neutrophils Relative %: 55 %
Platelets: 242 10*3/uL (ref 150–400)
RBC: 4.22 MIL/uL (ref 3.87–5.11)
RDW: 13.3 % (ref 11.5–15.5)
WBC: 8.3 10*3/uL (ref 4.0–10.5)
nRBC: 0 % (ref 0.0–0.2)

## 2023-01-18 LAB — POCT FASTING CBG KUC MANUAL ENTRY: POCT Glucose (KUC): 219 mg/dL — AB (ref 70–99)

## 2023-01-18 LAB — TROPONIN I (HIGH SENSITIVITY): Troponin I (High Sensitivity): 5 ng/L (ref <18)

## 2023-01-18 MED ORDER — MECLIZINE HCL 25 MG PO TABS: 25.0000 mg | ORAL_TABLET | Freq: Three times a day (TID) | ORAL | 0 refills | Status: DC | PRN

## 2023-01-18 MED ORDER — MECLIZINE HCL 25 MG PO TABS: 25.0000 mg | ORAL_TABLET | Freq: Three times a day (TID) | ORAL | 0 refills | Status: AC | PRN

## 2023-01-18 NOTE — ED Triage Notes (Signed)
Triages by provider

## 2023-01-18 NOTE — ED Triage Notes (Signed)
Pt from urgent care for near syncopal episode, pt took expired vertigo medication. Fall 2 weeks ago. Pt denies pain/injury, c/o mild dizziness en route. Pt lives at [facility].  NSR BGL- 218 12 96% 131/88 92

## 2023-01-18 NOTE — ED Notes (Signed)
Patient is being discharged from the Urgent Care and sent to the Emergency Department via EMS . Per provider Laurel Dimmer., patient is in need of higher level of care due to abnormal EKG and dizziness for 2 days. Patient is aware and verbalizes understanding of plan of care.  Vitals:   01/18/23 1351  BP: (!) 136/93  Pulse: (!) 122  Resp: 18  Temp: 97.8 F (36.6 C)  SpO2: 95%

## 2023-01-18 NOTE — ED Provider Notes (Signed)
[facility] Provider Note    Event Date/Time   First MD Initiated Contact with Patient 01/18/23 1648     (approximate)   History   No chief complaint on file.   HPI  Kristin Deleon is a 89 y.o. female with a history of vertigo presents to the ER from urgent care due to episode of vertigo as well as high blood sugar.  Has a history of diabetes.  She states that she feels well and does not want to be here.  She denies any headache.  Took some meclizine for her dizziness earlier today.  Denies any falls.  No numbness or tingling no palpitations no chest pain.     Physical Exam   Triage Vital Signs: ED Triage Vitals  Encounter Vitals Group     BP 01/18/23 1443 119/89     Systolic BP Percentile --      Diastolic BP Percentile --      Pulse Rate 01/18/23 1443 99     Resp 01/18/23 1443 16     Temp 01/18/23 1443 98 F (36.7 C)     Temp Source 01/18/23 1443 Oral     SpO2 01/18/23 1443 96 %     Weight --      Height --      Head Circumference --      Peak Flow --      Pain Score 01/18/23 1442 0     Pain Loc --      Pain Education --      Exclude from Growth Chart --     Most recent vital signs: Vitals:   01/18/23 1443  BP: 119/89  Pulse: 99  Resp: 16  Temp: 98 F (36.7 C)  SpO2: 96%     Constitutional: Alert  Eyes: Conjunctivae are normal.  Head: Atraumatic. Nose: No congestion/rhinnorhea. Mouth/Throat: Mucous membranes are moist.   Neck: Painless ROM.  Cardiovascular:   Good peripheral circulation. Respiratory: Normal respiratory effort.  No retractions.  Gastrointestinal: Soft and nontender.  Musculoskeletal:  no deformity Neurologic:  CN- intact.  No facial droop, Normal FNF.  Normal heel to shin.  Sensation intact bilaterally. Normal speech and language. No gross focal neurologic deficits are appreciated. No gait instability. Skin:  Skin is warm, dry and intact. No rash noted. Psychiatric: Mood and affect are normal. Speech and  behavior are normal.    ED Results / Procedures / Treatments   Labs (all labs ordered are listed, but only abnormal results are displayed) Labs Reviewed  COMPREHENSIVE METABOLIC PANEL - Abnormal; Notable for the following components:      Result Value   Glucose, Bld 194 (*)    Creatinine, Ser 1.03 (*)    GFR, Estimated 51 (*)    All other components within normal limits  CBC WITH DIFFERENTIAL/PLATELET  TROPONIN I (HIGH SENSITIVITY)     EKG  ED ECG REPORT I, Willy Eddy, the attending physician, personally viewed and interpreted this ECG.   Date: 01/18/2023  EKG Time: 14:46  Rate: 100  Rhythm: sinus  Axis: left  Intervals: rbbb  ST&T Change: no stemi, no depressions    RADIOLOGY Please see ED Course for my review and interpretation.  I personally reviewed all radiographic images ordered to evaluate for the above acute complaints and reviewed radiology reports and findings.  These findings were personally discussed with the patient.  Please see medical record for radiology report.    PROCEDURES:  Critical Care performed:  Procedures   MEDICATIONS ORDERED IN ED: Medications - No data to display   IMPRESSION / MDM / ASSESSMENT AND PLAN / ED COURSE  I reviewed the triage vital signs and the nursing notes.                              Differential diagnosis includes, but is not limited to, bppv, dehydration, electrolyte abn, cva, sdh,  Patient presenting to the ER for evaluation of symptoms as described above.  Based on symptoms, risk factors and considered above differential, this presenting complaint could reflect a potentially life-threatening illness therefore the patient will be placed on continuous pulse oximetry and telemetry for monitoring.  Laboratory evaluation will be sent to evaluate for the above complaints.    Patient's blood work is reassuring.  CT head with no acute findings.  She is currently asymptomatic.  Doses identical to previous  episodes of vertigo.  Do not feel that further diagnostic testing clinically indicated practically as the patient does not wish to have any performed at this time.  She is appropriate for outpatient follow-up.      FINAL CLINICAL IMPRESSION(S) / ED DIAGNOSES   Final diagnoses:  Dizziness     Rx / DC Orders   ED Discharge Orders          Ordered    meclizine (ANTIVERT) 25 MG tablet  3 times daily PRN        01/18/23 1817             Note:  This document was prepared using Dragon voice recognition software and may include unintentional dictation errors.    Willy Eddy, MD 01/18/23 [phone]

## 2023-01-18 NOTE — ED Provider Notes (Signed)
[facility]    CSN: [id number] Arrival date & time: 01/18/23  1334      History   Chief Complaint Chief Complaint  Patient presents with   Dizziness    HPI Kristin Deleon is a 89 y.o. female, history of type 2 diabetes, hypertension, frequent falls, dementia,  HPI Patient is today for evaluation of 2 days of worsening dizziness which she reports has been vertigo.  Patient has no history of vertigo.  Reports that noticing this morning that her blood sugar was elevated and here in clinic it is 218.  Patient's last A1c was 7.8.  Is tachycardic on arrival.  Baseline pulse rate according to chart review was in 60s and 70s.  She saw her doctor 3 weeks ago for routine follow-up was unremarkable.  Denies any cardiac history however on chart review patient has a history of hypertension and is currently managed with amlodipine and losartan according to chart review.  She is not actively having any chest pain or shortness of breath dizziness has improved while in the exam room.  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, subsequent encounter 01/25/2022   Allergic rhinitis 02/17/2020   Hyperlipidemia 02/17/2020   Hypertension 02/17/2020   Meniere's disease 02/17/2020   Postmenopausal 02/17/2020   Thyroid nodule 02/17/2020   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   Cervical vertebral fusion 12/14/2014   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/06/2014   Bilateral hip pain 06/23/2014   Bilateral shoulder pain 06/23/2014   Chronic back pain 06/23/2014   Acute postoperative pain 12/16/2013   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   History of total hip arthroplasty 01/26/2013   Presence of artificial knee joint 01/26/2013   Loose orthopedic implant (HCC) 01/26/2013   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   Diabetes mellitus, type 2 (HCC) 04/11/2012    Past Surgical History:  Procedure Laterality Date   ABDOMINAL HYSTERECTOMY     CATARACT EXTRACTION     CATARACT EXTRACTION W/PHACO Right 09/19/2016   Procedure: CATARACT EXTRACTION PHACO AND INTRAOCULAR LENS PLACEMENT (IOC) Diabetic  Right eye;  Surgeon: Lockie Mola, MD;  Location: [facility];  Service: Ophthalmology;  Laterality: Right;  Diabetic-oral med   CERVICAL FUSION     for fracture C1 and C2   CHOLECYSTECTOMY     ESOPHAGOGASTRODUODENOSCOPY (EGD) WITH PROPOFOL N/A 05/28/2017   Procedure: ESOPHAGOGASTRODUODENOSCOPY (EGD) WITH PROPOFOL;  Surgeon: Toledo, Boykin Nearing, MD;  Location: [facility];  Service: Gastroenterology;  Laterality: N/A;   JOINT REPLACEMENT Bilateral    knee and hip   KNEE ARTHROSCOPY     multiple   Morton's neuroma removed from foot     ROTATOR CUFF REPAIR Right    TONSILLECTOMY      OB History   No obstetric history on file.  Home Medications    Prior to Admission medications   Medication Sig Start Date End Date Taking? Authorizing Provider  Acetaminophen 325 MG CAPS Take by mouth.    [provider]  amLODipine (NORVASC) 5 MG tablet Take by mouth. 11/23/19   [provider]  aspirin EC 81 MG tablet Take 81 mg by mouth daily. am    [provider]  Calcium Carbonate-Vit D-Min (CALTRATE 600+D PLUS PO) Take by mouth. am    [provider]  Calcium Carbonate-Vitamin D  600-200 MG-UNIT TABS Take 1 tablet by mouth daily.    [provider]  Cholecalciferol (VITAMIN D3) 2000 units TABS Take by mouth daily. am    [provider]  CINNAMON PO Take 1,000 mg by mouth daily. am    [provider]  Coenzyme Q10 (COQ10) 200 MG CAPS Take by mouth. Dinner    [provider]  Cranberry-Cholecalciferol 4200-500 MG-UNIT CAPS Take by mouth daily. dinner    [provider]  diphenhydramine-acetaminophen (TYLENOL PM) 25-500 MG TABS tablet Take 1 tablet by mouth at bedtime as needed.    [provider]  ezetimibe (ZETIA) 10 MG tablet Take by mouth. 02/11/20   [provider]  ipratropium (ATROVENT) 0.03 % nasal spray Place 2 sprays into both nostrils 2 (two) times daily.    [provider]  Latanoprost (XALATAN OP) Apply 0.005 % to eye at bedtime. 1 drop per eye hour of sleep    [provider]  losartan (COZAAR) 50 MG tablet Take by mouth. breakfast 04/08/13   [provider]  metFORMIN (GLUCOPHAGE) 1000 MG tablet Take 1,000 mg by mouth 2 (two) times daily with a meal. Breakfast and dinner    [provider]  Multiple Vitamins-Minerals (CENTRUM SILVER 50+WOMEN PO) Take by mouth. am    [provider]  NEOMYCIN-POLYMYXIN-HYDROCORTISONE (CORTISPORIN) 1 % SOLN OTIC solution Apply 1-2 drops to toe BID after soaking 02/17/20   Hyatt, Max T, DPM  spironolactone (ALDACTONE) 25 MG tablet Take by mouth. 02/11/20 02/10/21  [provider]  triamcinolone cream (KENALOG) 0.1 % Apply 1 application topically as needed. Apply externally to the affected area twice daily    [provider]  vitamin B-12 (CYANOCOBALAMIN) 500 MCG tablet Take 500 mcg by mouth daily. dinner    [provider]    Family History Family History  Problem Relation Age of Onset   Hypertension Sister    Diabetes Sister    Thyroid disease Sister    Bipolar disorder Son    Diabetes Maternal  Grandmother     Social History Social History   Tobacco Use   Smoking status: Never   Smokeless tobacco: Never  Vaping Use   Vaping status: Never Used  Substance Use Topics   Alcohol use: Yes    Alcohol/week: 2.0 standard drinks of alcohol    Types: 2 Glasses of wine per week   Drug use: No     Allergies   Amlodipine, Codeine, Fentanyl, Hydromorphone hcl, Morphine, Nucynta [tapentadol], Statins, Tegaderm ag mesh [silver], and Tetracyclines & related   Review of Systems Review of Systems Pertinent negatives listed in HPI   Physical Exam Triage Vital Signs ED Triage Vitals [01/18/23 1351]  Encounter Vitals Group     BP (!) 136/93     Systolic BP Percentile      Diastolic BP Percentile      Pulse Rate (!) 122     Resp 18  Temp 97.8 F (36.6 C)     Temp src      SpO2 95 %     Weight      Height      Head Circumference      Peak Flow      Pain Score      Pain Loc      Pain Education      Exclude from Growth Chart    No data found.  Updated Vital Signs BP (!) 136/93   Pulse (!) 122   Temp 97.8 F (36.6 C)   Resp 18   SpO2 95%   Visual Acuity Right Eye Distance:   Left Eye Distance:   Bilateral Distance:    Right Eye Near:   Left Eye Near:    Bilateral Near:     Physical Exam Constitutional:      Appearance: Normal appearance.  HENT:     Head: Normocephalic and atraumatic.  Eyes:     Extraocular Movements: Extraocular movements intact.     Pupils: Pupils are equal, round, and reactive to light.  Cardiovascular:     Rate and Rhythm: Tachycardia present. Rhythm irregular.  Pulmonary:     Effort: Pulmonary effort is normal.     Breath sounds: Normal breath sounds.  Musculoskeletal:     Cervical back: Normal range of motion.     Comments: Ambulates with walker at baseline  Neurological:     Mental Status: She is alert.     Coordination: Coordination abnormal.     Gait: Gait abnormal.    UC Treatments / Results  Labs (all labs ordered  are listed, but only abnormal results are displayed) Labs Reviewed  POCT FASTING CBG [facility] MANUAL ENTRY - Abnormal; Notable for the following components:      Result Value   POCT Glucose ([facility]) 219 (*)    All other components within normal limits  POCT URINALYSIS DIP (MANUAL ENTRY)    EKG Sinus Tachycardia with RBBB, no comparison tracing available. Rate 106   Radiology No results found.  Procedures Procedures (including critical care time)  Medications Ordered in UC Medications - No data to display  Initial Impression / Assessment and Plan / UC Course  I have reviewed the triage vital signs and the nursing notes.  Pertinent labs & imaging results that were available during my care of the patient were reviewed by me and considered in my medical decision making (see chart for details).    89 year old pleasant female with a history of diabetes, hypertension, dementia presents with history of worsening dizziness over the last 24 hours later blood sugar.  Vital signs initially abnormal showing a heart rate of 122 when her baseline is in 60s and 70s beats per minute. Obtained an EKG showed sinus tachycardia rate 106 with a right bundle branch block.  Given patient's comorbidities, age, no onset dizziness as patient has no history of vertigo patient warrants a higher level of evaluation and care to rule out any acute cardiac or neurovascular insult may be contributing to symptoms.  EMS called transported patient to [facility]. Neighbor was with patient and was at bedside patient transported by EMS. Final Clinical Impressions(s) / UC Diagnoses   Final diagnoses:  Dizziness  Abnormal ECG  Tachycardia  Type 2 diabetes mellitus with hyperglycemia, without long-term current use of insulin [facility])   Discharge Instructions   None    ED Prescriptions   None    PDMP not reviewed this encounter.  Bing Neighbors, NP 01/18/23 1435

## 2023-01-18 NOTE — ED Notes (Signed)
Pt to CT

## 2023-01-18 NOTE — ED Notes (Signed)
22g IV placed in right forearm.

## 2023-01-21 ENCOUNTER — Encounter: Payer: Self-pay | Admitting: Student

## 2023-01-21 ENCOUNTER — Ambulatory Visit: Payer: MEDICARE | Admitting: Student

## 2023-01-21 ENCOUNTER — Telehealth: Payer: Self-pay

## 2023-01-21 VITALS — BP 120/80 | HR 65 | Temp 98.0°F | Resp 17

## 2023-01-21 DIAGNOSIS — R42 Dizziness and giddiness: Secondary | ICD-10-CM | POA: Diagnosis not present

## 2023-01-21 DIAGNOSIS — E1165 Type 2 diabetes mellitus with hyperglycemia: Secondary | ICD-10-CM

## 2023-01-21 NOTE — Patient Instructions (Signed)
Please continue to drink 6-8 glasses of water per day.   Continue taking Meclizine 2-3x per day AS NEEDED for dizziness.

## 2023-01-21 NOTE — Transitions of Care (Post Inpatient/ED Visit) (Unsigned)
   01/21/2023  Name: Kristin Deleon MRN: [id number] DOB: 1927/09/15  Today's TOC FU Call Status: Today's TOC FU Call Status:: Unsuccessful Call (1st Attempt) Unsuccessful Call (1st Attempt) Date: 01/21/23  Attempted to reach the patient regarding the most recent Inpatient/ED visit. Patient phone states remote access code needed and ended call. I was unable to leave voicemail.  Follow Up Plan: Additional outreach attempts will be made to reach the patient to complete the Transitions of Care (Post Inpatient/ED visit) call.   Signature: Dominica Kent.D/RMA

## 2023-01-21 NOTE — Progress Notes (Signed)
Location:  [facility]   Place of Service:   [facility]  Provider: Agron Swiney  Code Status: DNR Goals of Care:     01/18/2023    2:42 PM  Advanced Directives  Does Patient Have a Medical Advance Directive? No  Would patient like information on creating a medical advance directive? No - Patient declined     Chief Complaint  Patient presents with   Follow-up    HPI: Patient is a 89 y.o. female seen today for an acute visit for follow up of emergency room.   She had Vertigo years ago and periodically she has vertigo- the room is spinnin. This one is lasting longer than usual. It's been >3 days. She went to urgent care on Friday and transitioned to Emergency room dur to hyperglycemia and dizziness.   She tried the meclizine last night and one this morning. She wasn't as dizzy this morning.   She had to slide out of bed and crawl to the bathroom. It's spinning.   She drinks plenty of fluids and is eating well. She lays down and keeps her eyes closed so she doesn't have the spinning. Eyes checked last year. It's not positional. No weakness. No falls this weekend.   Some ringing in her ears.   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   Diabetes mellitus without complication (HCC)    metformin-oral meds   HOH (hard of hearing)    hearing aids/ bilateral   Hypercholesteremia    Hypertension    moderate/ controlled on meds   Meniere syndrome    PONV (postoperative nausea and vomiting)    difficulty waking up    Past Surgical History:  Procedure Laterality Date   ABDOMINAL HYSTERECTOMY     CATARACT EXTRACTION     CATARACT EXTRACTION W/PHACO Right 09/19/2016   Procedure: CATARACT EXTRACTION PHACO AND INTRAOCULAR LENS PLACEMENT (IOC) Diabetic  Right eye;  Surgeon: Lockie Mola, MD;  Location: [facility];  Service: Ophthalmology;  Laterality: Right;  Diabetic-oral med   CERVICAL FUSION     for fracture C1 and C2    CHOLECYSTECTOMY     ESOPHAGOGASTRODUODENOSCOPY (EGD) WITH PROPOFOL N/A 05/28/2017   Procedure: ESOPHAGOGASTRODUODENOSCOPY (EGD) WITH PROPOFOL;  Surgeon: Toledo, Boykin Nearing, MD;  Location: [facility];  Service: Gastroenterology;  Laterality: N/A;   JOINT REPLACEMENT Bilateral    knee and hip   KNEE ARTHROSCOPY     multiple   Morton's neuroma removed from foot     ROTATOR CUFF REPAIR Right    TONSILLECTOMY      Allergies  Allergen Reactions   Amlodipine Swelling    Lower leg edema   Tramadol Nausea Only   Codeine Nausea And Vomiting   Fentanyl Nausea And Vomiting   Hydromorphone Hcl Nausea And Vomiting   Morphine Itching and Nausea And Vomiting    Other reaction(s): Vomiting/ feels like ants under skin   Nucynta [Tapentadol] Other (See Comments)    hallucinations   Statins     Muscle pain    Tegaderm Ag Mesh [Silver] Other (See Comments)    blisters   Tetracaine Itching   Tetracyclines & Related Rash    Outpatient Encounter Medications as of 01/21/2023  Medication Sig   Acetaminophen 325 MG CAPS Take by mouth.   amLODipine (NORVASC) 5 MG tablet Take by mouth.   aspirin EC 81 MG tablet Take 81 mg by mouth daily. am   Calcium  Carbonate-Vit D-Min (CALTRATE 600+D PLUS PO) Take by mouth. am   Calcium Carbonate-Vitamin D 600-200 MG-UNIT TABS Take 1 tablet by mouth daily.   Cholecalciferol (VITAMIN D3) 2000 units TABS Take by mouth daily. am   CINNAMON PO Take 1,000 mg by mouth daily. am   Coenzyme Q10 (COQ10) 200 MG CAPS Take by mouth. Dinner   Cranberry-Cholecalciferol 4200-500 MG-UNIT CAPS Take by mouth daily. dinner   diphenhydramine-acetaminophen (TYLENOL PM) 25-500 MG TABS tablet Take 1 tablet by mouth at bedtime as needed.   ezetimibe (ZETIA) 10 MG tablet Take by mouth.   ipratropium (ATROVENT) 0.03 % nasal spray Place 2 sprays into both nostrils 2 (two) times daily.   Latanoprost (XALATAN OP) Apply 0.005 % to eye at bedtime. 1 drop per eye hour of sleep   losartan  (COZAAR) 50 MG tablet Take by mouth. breakfast   meclizine (ANTIVERT) 25 MG tablet Take 1 tablet (25 mg total) by mouth 3 (three) times daily as needed for dizziness.   metFORMIN (GLUCOPHAGE) 1000 MG tablet Take 1,000 mg by mouth 2 (two) times daily with a meal. Breakfast and dinner   Multiple Vitamins-Minerals (CENTRUM SILVER 50+WOMEN PO) Take by mouth. am   NEOMYCIN-POLYMYXIN-HYDROCORTISONE (CORTISPORIN) 1 % SOLN OTIC solution Apply 1-2 drops to toe BID after soaking   spironolactone (ALDACTONE) 25 MG tablet Take by mouth.   triamcinolone cream (KENALOG) 0.1 % Apply 1 application topically as needed. Apply externally to the affected area twice daily   vitamin B-12 (CYANOCOBALAMIN) 500 MCG tablet Take 500 mcg by mouth daily. dinner   No facility-administered encounter medications on file as of 01/21/2023.    Review of Systems:  Review of Systems  Health Maintenance  Topic Date Due   FOOT EXAM  Never done   DEXA SCAN  Never done   DTaP/Tdap/Td (2 - Td or Tdap) 05/12/2020   COVID-19 Vaccine (7 - 2023-24 season) 10/17/2022   INFLUENZA VACCINE  01/10/2023   HEMOGLOBIN A1C  06/26/2023   Medicare Annual Wellness (AWV)  08/22/2023   OPHTHALMOLOGY EXAM  10/18/2023   Pneumonia Vaccine 5+ Years old  Completed   Zoster Vaccines- Shingrix  Completed   HPV VACCINES  Aged Out    Physical Exam: Vitals:   01/21/23 1500  BP: 120/80  Pulse: 65  Resp: 17  Temp: 98 F (36.7 C)  SpO2: 95%   There is no height or weight on file to calculate BMI. Physical Exam Vitals reviewed.  Cardiovascular:     Rate and Rhythm: Normal rate and regular rhythm.     Pulses: Normal pulses.  Pulmonary:     Effort: Pulmonary effort is normal.  Abdominal:     General: Abdomen is flat.     Palpations: Abdomen is soft.  Neurological:     Mental Status: She is alert.     Comments: Oriented to place and self     Labs reviewed: Basic Metabolic Panel: Recent Labs    12/24/22 0830 01/18/23 1452  NA 140  137  K 4.3 3.7  CL 102 105  CO2 30 23  GLUCOSE 215* 194*  BUN 20 19  CREATININE 1.07* 1.03*  CALCIUM 9.7 9.3   Liver Function Tests: Recent Labs    12/24/22 0830 01/18/23 1452  AST 14 18  ALT 8 12  ALKPHOS  --  61  BILITOT 0.5 0.9  PROT 6.4 6.9  ALBUMIN  --  3.8   No results for input(s): "LIPASE", "AMYLASE" in the last 8760 hours. No  results for input(s): "AMMONIA" in the last 8760 hours. CBC: Recent Labs    12/24/22 0830 01/18/23 1452  WBC 7.9 8.3  NEUTROABS 3,974 4.6  HGB 12.1 12.7  HCT 37.6 38.6  MCV 92.6 91.5  PLT 213 242   Lipid Panel: No results for input(s): "CHOL", "HDL", "LDLCALC", "TRIG", "CHOLHDL", "LDLDIRECT" in the last 8760 hours. Lab Results  Component Value Date   HGBA1C 7.8 (H) 12/24/2022    Procedures since last visit: CT HEAD WO CONTRAST ( )  Result Date: 01/18/2023 CLINICAL DATA:  Mental status change EXAM: CT HEAD WITHOUT CONTRAST TECHNIQUE: Contiguous axial images were obtained from the base of the skull through the vertex without intravenous contrast. RADIATION DOSE REDUCTION: This exam was performed according to the departmental dose-optimization program which includes automated exposure control, adjustment of the mA and/or kV according to patient size and/or use of iterative reconstruction technique. COMPARISON:  Head CT 06/18/2022 FINDINGS: Brain: No evidence of acute infarction, hemorrhage, hydrocephalus, extra-axial collection or mass lesion/mass effect. Again seen is mild diffuse atrophy and moderate periventricular white matter hypodensity, likely chronic small vessel ischemic change. Vascular: No hyperdense vessel or unexpected calcification. Skull: Normal. Negative for fracture or focal lesion. Sinuses/Orbits: There is an air-fluid level in the left maxillary sinus. Other: There are postsurgical changes at C1, grossly unchanged. IMPRESSION: 1. No acute intracranial process. 2. Air-fluid level in the left maxillary sinus, which can be seen  in the setting of acute sinusitis. Electronically Signed   By: Darliss Cheney M.D.   On: 01/18/2023 18:10    Assessment/Plan Vertigo  Type 2 diabetes mellitus with hyperglycemia, without long-term current use of insulin (HCC) Patient with history of vertigo acutely worsened in the last few days. Improving with start of meclizine. Continue meclizine PRN. Negative CT for acute bleed. With improving symptoms will defer further imaging at this time. Glucose 219 in the ED, will continue current medications and follow up symptoms in 1 month.   Labs/tests ordered:  * No order type specified * Next appt:  03/27/2023  There are other unrelated non-urgent complaints, but due to the busy schedule and the amount of time I've already spent with her, time does not permit me to address these routine issues at today's visit. I've requested another appointment to review these additional issues. I spent greater than 40 minutes for the care of this patient in face to face time, chart review, clinical documentation, patient education.

## 2023-01-22 NOTE — Transitions of Care (Post Inpatient/ED Visit) (Signed)
   01/22/2023  Name: Kristin Deleon MRN: [id number] DOB: 1927-11-08  Today's TOC FU Call Status: Today's TOC FU Call Status:: Successful TOC FU Call Completed Unsuccessful Call (1st Attempt) Date: 01/21/23 [facility] FU Call Complete Date: 01/22/23  Transition Care Management Follow-up Telephone Call Date of Discharge: 01/18/23 Discharge Facility: [facility]) Type of Discharge: Emergency Department Reason for ED Visit: Other: (Dizziness)  Items Reviewed:    Medications Reviewed Today: Medications Reviewed Today   Medications were not reviewed in this encounter     Home Care and Equipment/Supplies:    Functional Questionnaire:    Follow up appointments reviewed: PCP Follow-up appointment confirmed?: No MD Provider Line [phone] Given: No Specialist Hospital Follow-up appointment confirmed?: No Do you need transportation to your follow-up appointment?: No  PATIENT STATES THAT SHE WAS SEEN 01/21/2023 AND SHE DOESN'T NEED TOC APPOINTMENT  SIGNATURE: Billy Rocco.D/RMA

## 2023-01-25 ENCOUNTER — Ambulatory Visit: Payer: MEDICARE | Admitting: Student

## 2023-03-25 ENCOUNTER — Ambulatory Visit: Payer: MEDICARE | Admitting: Student

## 2023-03-27 ENCOUNTER — Ambulatory Visit: Payer: MEDICARE | Admitting: Student

## 2023-03-27 ENCOUNTER — Encounter: Payer: Self-pay | Admitting: Student

## 2023-03-27 VITALS — BP 118/68 | HR 82 | Temp 98.4°F | Ht 62.0 in | Wt 132.0 lb

## 2023-03-27 DIAGNOSIS — I10 Essential (primary) hypertension: Secondary | ICD-10-CM | POA: Diagnosis not present

## 2023-03-27 DIAGNOSIS — R42 Dizziness and giddiness: Secondary | ICD-10-CM

## 2023-03-27 DIAGNOSIS — E1165 Type 2 diabetes mellitus with hyperglycemia: Secondary | ICD-10-CM

## 2023-03-27 DIAGNOSIS — M532X1 Spinal instabilities, occipito-atlanto-axial region: Secondary | ICD-10-CM

## 2023-03-27 DIAGNOSIS — G301 Alzheimer's disease with late onset: Secondary | ICD-10-CM

## 2023-03-27 DIAGNOSIS — F02A0 Dementia in other diseases classified elsewhere, mild, without behavioral disturbance, psychotic disturbance, mood disturbance, and anxiety: Secondary | ICD-10-CM | POA: Diagnosis not present

## 2023-03-27 NOTE — Progress Notes (Signed)
Location:  [facility].   Provider: Dr. Earnestine Mealing  Code Status: DNR Goals of Care:     03/27/2023   12:59 PM  Advanced Directives  Does Patient Have a Medical Advance Directive? Yes  Type of Advance Directive Out of facility DNR (pink MOST or yellow form)  Does patient want to make changes to medical advance directive? No - Patient declined     Chief Complaint  Patient presents with   Medical Management of Chronic Issues    Medical Management of Chronic Issues.     HPI: Patient is a 89 y.o. female seen today for medical management of chronic diseases.  Denies concerns at this time. Denies any recent falls.   She is doing well. Has an appointment with Endocrine coming up tomorrow. Will have her labs collected there at that time.   She is due for tetanus and flu vaccine.   She has antivert for dizziness - she had an attack over the last month. 3 days in bed. She has some antivert which somewhat helped with those symptoms. As long as she keeps her eyes closed she is fine.    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   Diabetes mellitus without complication (HCC)    metformin-oral meds   HOH (hard of hearing)    hearing aids/ bilateral   Hypercholesteremia    Hypertension    moderate/ controlled on meds   Meniere syndrome    PONV (postoperative nausea and vomiting)    difficulty waking up    Past Surgical History:  Procedure Laterality Date   ABDOMINAL HYSTERECTOMY     CATARACT EXTRACTION     CATARACT EXTRACTION W/PHACO Right 09/19/2016   Procedure: CATARACT EXTRACTION PHACO AND INTRAOCULAR LENS PLACEMENT (IOC) Diabetic  Right eye;  Surgeon: Lockie Mola, MD;  Location: [facility];  Service: Ophthalmology;  Laterality: Right;  Diabetic-oral med   CERVICAL FUSION     for fracture C1 and C2   CHOLECYSTECTOMY     ESOPHAGOGASTRODUODENOSCOPY (EGD) WITH PROPOFOL N/A 05/28/2017   Procedure:  ESOPHAGOGASTRODUODENOSCOPY (EGD) WITH PROPOFOL;  Surgeon: Toledo, Boykin Nearing, MD;  Location: [facility];  Service: Gastroenterology;  Laterality: N/A;   JOINT REPLACEMENT Bilateral    knee and hip   KNEE ARTHROSCOPY     multiple   Morton's neuroma removed from foot     ROTATOR CUFF REPAIR Right    TONSILLECTOMY      Allergies  Allergen Reactions   Amlodipine Swelling    Lower leg edema   Tramadol Nausea Only   Codeine Nausea And Vomiting   Fentanyl Nausea And Vomiting   Hydromorphone Hcl Nausea And Vomiting   Morphine Itching and Nausea And Vomiting    Other reaction(s): Vomiting/ feels like ants under skin   Nucynta [Tapentadol] Other (See Comments)    hallucinations   Statins     Muscle pain    Tegaderm Ag Mesh [Silver] Other (See Comments)    blisters   Tetracaine Itching   Tetracyclines & Related Rash    Outpatient Encounter Medications as of 03/27/2023  Medication Sig   Acetaminophen 325 MG CAPS Take by mouth.   amLODipine (NORVASC) 5 MG tablet Take by mouth.   aspirin EC 81 MG tablet Take 81 mg by mouth daily. am   Calcium Carbonate-Vit D-Min (CALTRATE 600+D PLUS PO) Take by mouth. am   Calcium Carbonate-Vitamin D 600-200 MG-UNIT TABS Take 1 tablet by  mouth daily.   Cholecalciferol (VITAMIN D3) 2000 units TABS Take by mouth daily. am   CINNAMON PO Take 1,000 mg by mouth daily. am   Coenzyme Q10 (COQ10) 200 MG CAPS Take by mouth. Dinner   Cranberry-Cholecalciferol 4200-500 MG-UNIT CAPS Take by mouth daily. dinner   ezetimibe (ZETIA) 10 MG tablet Take by mouth.   ipratropium (ATROVENT) 0.03 % nasal spray Place 2 sprays into both nostrils 2 (two) times daily.   Latanoprost (XALATAN OP) Apply 0.005 % to eye at bedtime. 1 drop per eye hour of sleep   losartan (COZAAR) 50 MG tablet Take by mouth. breakfast   meclizine (ANTIVERT) 25 MG tablet Take 1 tablet (25 mg total) by mouth 3 (three) times daily as needed for dizziness.   metFORMIN (GLUCOPHAGE) 1000 MG tablet Take  1,000 mg by mouth 2 (two) times daily with a meal. Breakfast and dinner   Multiple Vitamins-Minerals (CENTRUM SILVER 50+WOMEN PO) Take by mouth. am   NEOMYCIN-POLYMYXIN-HYDROCORTISONE (CORTISPORIN) 1 % SOLN OTIC solution Apply 1-2 drops to toe BID after soaking   spironolactone (ALDACTONE) 25 MG tablet Take by mouth.   triamcinolone cream (KENALOG) 0.1 % Apply 1 application topically as needed. Apply externally to the affected area twice daily   vitamin B-12 (CYANOCOBALAMIN) 500 MCG tablet Take 500 mcg by mouth daily. dinner   [DISCONTINUED] diphenhydramine-acetaminophen (TYLENOL PM) 25-500 MG TABS tablet Take 1 tablet by mouth at bedtime as needed.   No facility-administered encounter medications on file as of 03/27/2023.    Review of Systems:  Review of Systems  Health Maintenance  Topic Date Due   DEXA SCAN  Never done   DTaP/Tdap/Td (2 - Td or Tdap) 05/12/2020   INFLUENZA VACCINE  01/10/2023   COVID-19 Vaccine (8 - 2023-24 season) 05/03/2023   HEMOGLOBIN A1C  06/26/2023   Medicare Annual Wellness (AWV)  08/22/2023   OPHTHALMOLOGY EXAM  10/18/2023   FOOT EXAM  03/26/2024   Pneumonia Vaccine 64+ Years old  Completed   Zoster Vaccines- Shingrix  Completed   HPV VACCINES  Aged Out    Physical Exam: Vitals:   03/27/23 1256  BP: 118/68  Pulse: 82  Temp: 98.4 F (36.9 C)  SpO2: 94%  Weight: 132 lb (59.9 kg)  Height: 5\' 2"  (1.575 m)   Body mass index is 24.14 kg/m. Physical Exam Neck:     Comments: Holding head in fixed position, no neck brace in place Cardiovascular:     Rate and Rhythm: Normal rate and regular rhythm.  Pulmonary:     Effort: Pulmonary effort is normal.     Breath sounds: Normal breath sounds.  Abdominal:     General: Abdomen is flat.     Palpations: Abdomen is soft.  Neurological:     Mental Status: She is alert.     Title   Diabetic Foot Exam - detailed Date & Time: 03/27/2023  1:48 AM Diabetic Foot exam was performed with the following  findings: Yes  Visual Foot Exam completed.: Yes  Is there a history of foot ulcer?: No Is there a foot ulcer now?: No Is there swelling?: No Is there elevated skin temperature?: No Is there abnormal foot shape?: No Is there a claw toe deformity?: No Are the toenails long?: No Are the toenails thick?: Yes (Comment: second toe) Are the toenails ingrown?: No Is the skin thin, fragile, shiny and hairless?": No Normal Range of Motion?: Yes Is there foot or ankle muscle weakness?: No Do you have pain in  calf while walking?: No Are the shoes appropriate in style and fit?: Yes Can the patient see the bottom of their feet?: Yes Pulse Foot Exam completed.: Yes   Right Posterior Tibialis: Present Left posterior Tibialis: Present      Sensory Foot Exam Completed.: Yes Semmes-Weinstein Monofilament Test "+" means "has sensation" and "-" means "no sensation"   R Site 1-Great Toe: Neg L Site 1-Great Toe: Neg   R Site 6: Neg L Site 6: Neg     Image components are not supported.   Image components are not supported. Image components are not supported.  [place] Comments      Labs reviewed: Basic Metabolic Panel: Recent Labs    12/24/22 0830 01/18/23 1452  NA 140 137  K 4.3 3.7  CL 102 105  CO2 30 23  GLUCOSE 215* 194*  BUN 20 19  CREATININE 1.07* 1.03*  CALCIUM 9.7 9.3   Liver Function Tests: Recent Labs    12/24/22 0830 01/18/23 1452  AST 14 18  ALT 8 12  ALKPHOS  --  61  BILITOT 0.5 0.9  PROT 6.4 6.9  ALBUMIN  --  3.8   No results for input(s): "LIPASE", "AMYLASE" in the last 8760 hours. No results for input(s): "AMMONIA" in the last 8760 hours. CBC: Recent Labs    12/24/22 0830 01/18/23 1452  WBC 7.9 8.3  NEUTROABS 3,974 4.6  HGB 12.1 12.7  HCT 37.6 38.6  MCV 92.6 91.5  PLT 213 242   Lipid Panel: No results for input(s): "CHOL", "HDL", "LDLCALC", "TRIG", "CHOLHDL", "LDLDIRECT" in the last 8760 hours. Lab Results  Component Value Date   HGBA1C  7.8 (H) 12/24/2022    Procedures since last visit: No results found.  Assessment/Plan Mild late onset Alzheimer's dementia, unspecified whether behavioral, psychotic, or mood disturbance or anxiety (HCC)  Vertigo  Type 2 diabetes mellitus with hyperglycemia, without long-term current use of insulin (HCC)  Hypertension, unspecified type  Atlantoaxial instability Patient without significant signs of acute decline with memory at this time.  Independent in activities of daily life including feeding, dressing, shower.  Ambulates independently without assistive device.  Weight remains stable at this time.  Discussed importance of hydration and protein supplementation at this time.  Patient is stable at a fast functional status of 4.  Vertigo intermittently declines.  Previously categorized as Mnire's disease currently on spironolactone as diuretic as well as Antivert for treatment.  Will consider different trial of medication in the future.  Diabetes well-controlled most recent A1c less than 8 at 7.8.  Appointment with endocrinology tomorrow for lab collection.  Continue metformin thousand twice daily.  Blood pressure well-controlled at this time continue spironolactone losartan at this time.  Continue amlodipine for blood pressure control.  Encourage patient to have flu and tetanus vaccine in upcoming months.  Labs/tests ordered:  * No order type specified * Next appt:  Visit date not found

## 2023-03-27 NOTE — Patient Instructions (Addendum)
Please go to the Pharmacy and have your tetanus vaccine at your soonest convenience.  A phlebotomist will come to your house tomorrow morning to collect your labs - A1c, kidney function

## 2023-06-28 ENCOUNTER — Ambulatory Visit: Payer: MEDICARE | Admitting: Student

## 2023-06-28 ENCOUNTER — Encounter: Payer: Self-pay | Admitting: Student

## 2023-06-28 VITALS — BP 126/82 | HR 101 | Temp 96.5°F | Ht 62.0 in | Wt 132.0 lb

## 2023-06-28 DIAGNOSIS — I10 Essential (primary) hypertension: Secondary | ICD-10-CM

## 2023-06-28 DIAGNOSIS — E1165 Type 2 diabetes mellitus with hyperglycemia: Secondary | ICD-10-CM

## 2023-06-28 DIAGNOSIS — G301 Alzheimer's disease with late onset: Secondary | ICD-10-CM | POA: Diagnosis not present

## 2023-06-28 DIAGNOSIS — M532X1 Spinal instabilities, occipito-atlanto-axial region: Secondary | ICD-10-CM | POA: Diagnosis not present

## 2023-06-28 DIAGNOSIS — F02A0 Dementia in other diseases classified elsewhere, mild, without behavioral disturbance, psychotic disturbance, mood disturbance, and anxiety: Secondary | ICD-10-CM

## 2023-06-28 NOTE — Patient Instructions (Addendum)
VISIT SUMMARY:  During today's visit, we reviewed your overall health status, including your diabetes, hypertension, and hyperlipidemia. You reported stable health with no new complaints and have been managing your conditions well with your current medications. We also discussed the need for routine lab work and an updated tetanus vaccine.  YOUR PLAN:  -TYPE 2 DIABETES MELLITUS: Type 2 Diabetes Mellitus is a condition where your body does not use insulin properly, leading to high blood sugar levels. Your last A1c was 7.8%, which is close to the target of less than 8%. You will continue taking metformin 1000 mg twice daily. We have ordered lab tests including A1c, CMP, TSH, and CBC, and a phlebotomist will visit your home on Monday at 7:30 AM to collect these labs. We will call you with the results.  -HYPERTENSION: Hypertension, or high blood pressure, is a condition where the force of the blood against your artery walls is too high. Your blood pressure today was 126/82 mmHg, which is well-controlled. You will continue taking amlodipine 5 mg, losartan 50 mg, and spironolactone 25 mg daily.  -HYPERLIPIDEMIA: Hyperlipidemia is a condition where you have high levels of lipids (fats) in your blood. You are currently managing this with Zetia 10 mg daily. No recent lipid panel was discussed, so you will continue with your current medication.  -GENERAL HEALTH MAINTENANCE: You are due for a tetanus vaccine, as your last one was in 2011. Your weight is stable at 132 lbs, the same as six months ago. We recommend getting the tetanus vaccine at your pharmacy. Please call the pharmacy ahead to ensure they have the vaccine available.  INSTRUCTIONS:  A phlebotomist will visit your home on Monday at 7:30 AM to collect your labs. We will call you with the results next week. Please also arrange to get your tetanus vaccine at your pharmacy and call ahead to ensure they have it available.

## 2023-06-28 NOTE — Progress Notes (Signed)
Location:  [facility] [place].   Provider: Dr. Earnestine Mealing  Code Status: Full Code Goals of Care:     06/28/2023    1:37 PM  Advanced Directives  Does Patient Have a Medical Advance Directive? No  Would patient like information on creating a medical advance directive? No - Patient declined     Chief Complaint  Patient presents with   Medical Management of Chronic Issues    Medical Management of Chronic Issues.    Quality Metric Gaps    To discuss need for Tdap, Flu, Covid, A1C and Dexa    HPI: Patient is a 89 y.o. female seen today for medical management of chronic diseases.   Discussed the use of AI scribe software for clinical note transcription with the patient, who gave verbal consent to proceed.  History of Present Illness   The patient, with a history of diabetes and hyperlipidemia, reports stable health with no new complaints. She has been monitoring her blood glucose levels at home, which she reports as being 'normal,' although she does not recall the specific values. She notes that she can usually tell if her blood glucose is high or low based on how she feels. She also mentions that if she deviates from her diet, she takes corrective measures such as drinking a lot of water. She denies any episodes of hyperglycemia that caused significant problems.  The patient is currently on metformin 1000mg  twice daily for diabetes, Zetia 10mg  for hyperlipidemia, and amlodipine 5mg , losartan 50mg , and spironolactone 25mg  for blood pressure control. She reports no issues with her bowel movements or urination, and denies any chest pain or shortness of breath.  The patient's weight has remained stable, and she has been preparing her own meals. She admits to occasionally indulging in cookies, but is mindful not to overconsume high carbohydrate foods. She expresses relief that neither of her children has inherited diabetes.  The patient's last tetanus vaccine was in 2011, and she is  due for another. She is also due for labs to check her A1c. The patient prefers to receive her results via a phone call.     She has an aid 2x per week and for 2 hours to help with laundry and driving.  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   Diabetes mellitus without complication (HCC)    metformin-oral meds   HOH (hard of hearing)    hearing aids/ bilateral   Hypercholesteremia    Hypertension    moderate/ controlled on meds   Meniere syndrome    PONV (postoperative nausea and vomiting)    difficulty waking up    Past Surgical History:  Procedure Laterality Date   ABDOMINAL HYSTERECTOMY     CATARACT EXTRACTION     CATARACT EXTRACTION W/PHACO Right 09/19/2016   Procedure: CATARACT EXTRACTION PHACO AND INTRAOCULAR LENS PLACEMENT (IOC) Diabetic  Right eye;  Surgeon: Lockie Mola, MD;  Location: [facility];  Service: Ophthalmology;  Laterality: Right;  Diabetic-oral med   CERVICAL FUSION     for fracture C1 and C2   CHOLECYSTECTOMY     ESOPHAGOGASTRODUODENOSCOPY (EGD) WITH PROPOFOL N/A 05/28/2017   Procedure: ESOPHAGOGASTRODUODENOSCOPY (EGD) WITH PROPOFOL;  Surgeon: Toledo, Boykin Nearing, MD;  Location: [facility];  Service: Gastroenterology;  Laterality: N/A;   JOINT REPLACEMENT Bilateral    knee and hip   KNEE ARTHROSCOPY     multiple   Morton's neuroma removed from foot  ROTATOR CUFF REPAIR Right    TONSILLECTOMY      Allergies  Allergen Reactions   Amlodipine Swelling    Lower leg edema   Tramadol Nausea Only   Codeine Nausea And Vomiting   Fentanyl Nausea And Vomiting   Hydromorphone Hcl Nausea And Vomiting   Morphine Itching and Nausea And Vomiting    Other reaction(s): Vomiting/ feels like ants under skin   Nucynta [Tapentadol] Other (See Comments)    hallucinations   Statins     Muscle pain    Tegaderm Ag Mesh [Silver] Other (See Comments)    blisters   Tetracaine Itching   Tetracyclines & Related Rash     Outpatient Encounter Medications as of 06/28/2023  Medication Sig   Acetaminophen 325 MG CAPS Take by mouth.   amLODipine (NORVASC) 5 MG tablet Take by mouth.   aspirin EC 81 MG tablet Take 81 mg by mouth daily. am   Calcium Carbonate-Vit D-Min (CALTRATE 600+D PLUS PO) Take by mouth. am   Calcium Carbonate-Vitamin D 600-200 MG-UNIT TABS Take 1 tablet by mouth daily.   Cholecalciferol (VITAMIN D3) 2000 units TABS Take by mouth daily. am   CINNAMON PO Take 1,000 mg by mouth daily. am   Coenzyme Q10 (COQ10) 200 MG CAPS Take by mouth. Dinner   Cranberry-Cholecalciferol 4200-500 MG-UNIT CAPS Take by mouth daily. dinner   ezetimibe (ZETIA) 10 MG tablet Take by mouth.   ipratropium (ATROVENT) 0.03 % nasal spray Place 2 sprays into both nostrils 2 (two) times daily.   Latanoprost (XALATAN OP) Apply 0.005 % to eye at bedtime. 1 drop per eye hour of sleep   losartan (COZAAR) 50 MG tablet Take by mouth. breakfast   meclizine (ANTIVERT) 25 MG tablet Take 1 tablet (25 mg total) by mouth 3 (three) times daily as needed for dizziness.   metFORMIN (GLUCOPHAGE) 1000 MG tablet Take 1,000 mg by mouth 2 (two) times daily with a meal. Breakfast and dinner   Multiple Vitamins-Minerals (CENTRUM SILVER 50+WOMEN PO) Take by mouth. am   NEOMYCIN-POLYMYXIN-HYDROCORTISONE (CORTISPORIN) 1 % SOLN OTIC solution Apply 1-2 drops to toe BID after soaking   spironolactone (ALDACTONE) 25 MG tablet Take by mouth.   triamcinolone cream (KENALOG) 0.1 % Apply 1 application topically as needed. Apply externally to the affected area twice daily   vitamin B-12 (CYANOCOBALAMIN) 500 MCG tablet Take 500 mcg by mouth daily. dinner   No facility-administered encounter medications on file as of 06/28/2023.    Review of Systems:  Review of Systems  Health Maintenance  Topic Date Due   DEXA SCAN  Never done   DTaP/Tdap/Td (2 - Td or Tdap) 05/12/2020   INFLUENZA VACCINE  01/10/2023   COVID-19 Vaccine (8 - 2024-25 season)  05/03/2023   HEMOGLOBIN A1C  06/26/2023   Medicare Annual Wellness (AWV)  08/22/2023   OPHTHALMOLOGY EXAM  10/18/2023   FOOT EXAM  03/26/2024   Pneumonia Vaccine 64+ Years old  Completed   Zoster Vaccines- Shingrix  Completed   HPV VACCINES  Aged Out    Physical Exam: Vitals:   06/28/23 1335  BP: 126/82  Pulse: (!) 101  Temp: (!) 96.5 F (35.8 C)  SpO2: 98%  Weight: 132 lb (59.9 kg)  Height: 5\' 2"  (1.575 m)   Body mass index is 24.14 kg/m. Physical Exam\ Physical Exam   VITALS: BP- 126/82 MEASUREMENTS: WT- 132 CHEST: Clear lung sounds. CARDIOVASCULAR: No murmurs. ABDOMEN: Active bowel sounds, not distended. EXTREMITIES: Trace edema in legs.  Labs reviewed: Basic Metabolic Panel: Recent Labs    12/24/22 0830 01/18/23 1452  NA 140 137  K 4.3 3.7  CL 102 105  CO2 30 23  GLUCOSE 215* 194*  BUN 20 19  CREATININE 1.07* 1.03*  CALCIUM 9.7 9.3   Liver Function Tests: Recent Labs    12/24/22 0830 01/18/23 1452  AST 14 18  ALT 8 12  ALKPHOS  --  61  BILITOT 0.5 0.9  PROT 6.4 6.9  ALBUMIN  --  3.8   No results for input(s): "LIPASE", "AMYLASE" in the last 8760 hours. No results for input(s): "AMMONIA" in the last 8760 hours. CBC: Recent Labs    12/24/22 0830 01/18/23 1452  WBC 7.9 8.3  NEUTROABS 3,974 4.6  HGB 12.1 12.7  HCT 37.6 38.6  MCV 92.6 91.5  PLT 213 242   Lipid Panel: No results for input(s): "CHOL", "HDL", "LDLCALC", "TRIG", "CHOLHDL", "LDLDIRECT" in the last 8760 hours. Lab Results  Component Value Date   HGBA1C 7.8 (H) 12/24/2022    Results   LABS A1c: 7.8% (12/2022)       Assessment/Plan Assessment and Plan    Type 2 Diabetes Mellitus   Type 2 Diabetes Mellitus with a last A1c of 7.8% in July, close to the target of <8%. Reports not regularly checking blood glucose but can usually tell if levels are high or low based on symptoms. Acknowledges dietary indiscretions but manages them by adjusting diet and increasing water  intake. Currently on metformin 1000 mg twice daily.   - Order A1c, CMP, TSH, and CBC   - Continue metformin 1000 mg twice daily   - Phlebotomist to visit home on Monday at 7:30 AM to collect labs   - Call with lab results    Hypertension   Hypertension well-controlled with current medications. Blood pressure today is 126/82 mmHg.   - Continue amlodipine 5 mg, losartan 50 mg, spironolactone 25 mg    Hyperlipidemia   Hyperlipidemia managed with Zetia 10 mg daily. No recent lipid panel discussed.   - Continue Zetia 10 mg daily    General Health Maintenance   Due for tetanus vaccine, last received in 2011. Weight stable at 132 lbs, same as six months ago.   - Recommend tetanus vaccine at pharmacy   - Call pharmacy ahead to ensure availability of tetanus vaccine    Follow-up   - Call with lab results next week.

## 2023-07-02 LAB — CBC
HCT: 43.1 % (ref 35.0–45.0)
Hemoglobin: 13.7 g/dL (ref 11.7–15.5)
MCH: 29.3 pg (ref 27.0–33.0)
MCHC: 31.8 g/dL — ABNORMAL LOW (ref 32.0–36.0)
MCV: 92.3 fL (ref 80.0–100.0)
MPV: 11.7 fL (ref 7.5–12.5)
Platelets: 230 10*3/uL (ref 140–400)
RBC: 4.67 10*6/uL (ref 3.80–5.10)
RDW: 12.1 % (ref 11.0–15.0)
WBC: 7.1 10*3/uL (ref 3.8–10.8)

## 2023-07-02 LAB — TSH: TSH: 2.39 m[IU]/L (ref 0.40–4.50)

## 2023-07-02 LAB — COMPLETE METABOLIC PANEL WITH GFR
AG Ratio: 1.8 (calc) (ref 1.0–2.5)
ALT: 9 U/L (ref 6–29)
AST: 16 U/L (ref 10–35)
Albumin: 4.6 g/dL (ref 3.6–5.1)
Alkaline phosphatase (APISO): 63 U/L (ref 37–153)
BUN/Creatinine Ratio: 17 (calc) (ref 6–22)
BUN: 17 mg/dL (ref 7–25)
CO2: 30 mmol/L (ref 20–32)
Calcium: 9.9 mg/dL (ref 8.6–10.4)
Chloride: 100 mmol/L (ref 98–110)
Creat: 1.03 mg/dL — ABNORMAL HIGH (ref 0.60–0.95)
Globulin: 2.6 g/dL (ref 1.9–3.7)
Glucose, Bld: 149 mg/dL — ABNORMAL HIGH (ref 65–99)
Potassium: 4.5 mmol/L (ref 3.5–5.3)
Sodium: 139 mmol/L (ref 135–146)
Total Bilirubin: 0.6 mg/dL (ref 0.2–1.2)
Total Protein: 7.2 g/dL (ref 6.1–8.1)
eGFR: 51 mL/min/{1.73_m2} — ABNORMAL LOW (ref >=60)

## 2023-07-02 LAB — LIPID PANEL
Cholesterol: 200 mg/dL — ABNORMAL HIGH (ref <200)
HDL: 55 mg/dL (ref >=50)
LDL Cholesterol (Calc): 120 mg/dL — ABNORMAL HIGH
Non-HDL Cholesterol (Calc): 145 mg/dL — ABNORMAL HIGH (ref <130)
Total CHOL/HDL Ratio: 3.6 (calc) (ref <5.0)
Triglycerides: 136 mg/dL (ref <150)

## 2023-07-02 LAB — HEMOGLOBIN A1C W/OUT EAG: Hgb A1c MFr Bld: 8.3 %{Hb} — ABNORMAL HIGH (ref <5.7)

## 2023-07-04 LAB — PROTEIN / CREATININE RATIO, URINE
Albumin, U: 1.1
Creatinine, Urine: 100

## 2023-07-04 LAB — MICROALBUMIN / CREATININE URINE RATIO
Albumin, Urine POC: 1.1
Microalb Creat Ratio: 11

## 2023-07-10 ENCOUNTER — Encounter: Payer: Self-pay | Admitting: Student

## 2023-08-09 ENCOUNTER — Encounter: Payer: Self-pay | Admitting: Student

## 2023-08-09 ENCOUNTER — Ambulatory Visit: Payer: MEDICARE | Admitting: Student

## 2023-08-09 VITALS — BP 134/82 | HR 61 | Temp 97.2°F | Ht 62.0 in | Wt 132.0 lb

## 2023-08-09 DIAGNOSIS — G301 Alzheimer's disease with late onset: Secondary | ICD-10-CM

## 2023-08-09 DIAGNOSIS — F02A0 Dementia in other diseases classified elsewhere, mild, without behavioral disturbance, psychotic disturbance, mood disturbance, and anxiety: Secondary | ICD-10-CM | POA: Diagnosis not present

## 2023-08-09 DIAGNOSIS — E1165 Type 2 diabetes mellitus with hyperglycemia: Secondary | ICD-10-CM | POA: Diagnosis not present

## 2023-08-09 NOTE — Patient Instructions (Signed)
 VISIT SUMMARY:  During today's visit, we discussed your recent episode of extreme fatigue and weakness, which was associated with a high blood sugar level. We reviewed your current diabetes management and made plans to improve your blood sugar control. We also talked about your general health maintenance and upcoming appointments.  YOUR PLAN:  -HYPERGLYCEMIA: Hyperglycemia means having high blood sugar levels. Your blood sugar was 300 mg/dL during the episode of fatigue. We will arrange for a nurse to check your blood glucose levels regularly and may consider adding a new medication if your levels remain high.  -DIABETES MELLITUS TYPE 2: Type 2 diabetes is a condition where your body does not use insulin properly, leading to high blood sugar levels. Your HbA1c is currently 8.3%, which indicates that your diabetes is not well-controlled. We discussed making dietary changes to help manage your diabetes and will continue with your current medications. We will also monitor your HbA1c levels regularly.  -GENERAL HEALTH MAINTENANCE: You plan to cancel your upcoming wellness visit with [facility] due to transportation challenges. Please notify [facility] to free up the appointment slot for another patient.  INSTRUCTIONS:  Please follow up with [place] on October 02, 2023. Make sure to notify [facility] to cancel your wellness visit scheduled for August 27, 2023.

## 2023-08-09 NOTE — Progress Notes (Signed)
 [facility]  Provider: Sydnee Cabal  Code Status: DNR Goals of Care:     06/28/2023    1:37 PM  Advanced Directives  Does Patient Have a Medical Advance Directive? No  Would patient like information on creating a medical advance directive? No - Patient declined     Chief Complaint  Patient presents with   Fatigue    Complains of Fatigue. Yesterday felt like she couldn't get up out of bed. EMT took BS and it was over 300, had not been taking Metformin. Got it filled yesterday and took this morning. Feeling better today.     HPI: Patient is a 89 y.o. female seen today for an acute visit . Discussed the use of AI scribe software for clinical note transcription with the patient, who gave verbal consent to proceed.  History of Present Illness   Kristin Deleon is a 89 year old female who presents with extreme fatigue and weakness. The appointment was made by a nurse at [facility].  She experiences extreme fatigue and weakness, which began suddenly during the night. She describes waking up feeling so weak that she feared she might fall if she attempted to get out of bed. Despite these symptoms, she did not feel sick and had no fever or other symptoms. This episode occurred two days ago, and she has not experienced similar symptoms before. No nausea, vomiting, chest pain, shortness of breath, lightheadedness, dizziness, leg swelling, or weight gain. She maintains a stable weight of 232 pounds.  Her blood sugar was found to be 300 when checked by the rescue squad, although she has not missed any doses of her medication. She typically takes her medication in the morning and had just refilled her prescription after the incident. Her last A1c was elevated at 8.3%.  She is physically active, attending the gym three times a week where she works with a trainer, focusing on walking and arm exercises. Despite the recent episode of fatigue, she was able to walk to her  mailbox, which is about half a city block away, but did not have the energy for other activities. She has an aide who visits twice a week for two hours to assist her. Her children have taken her car away, making transportation to appointments challenging and expensive.        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   Diabetes mellitus without complication (HCC)    metformin-oral meds   HOH (hard of hearing)    hearing aids/ bilateral   Hypercholesteremia    Hypertension    moderate/ controlled on meds   Meniere syndrome    PONV (postoperative nausea and vomiting)    difficulty waking up    Past Surgical History:  Procedure Laterality Date   ABDOMINAL HYSTERECTOMY     CATARACT EXTRACTION     CATARACT EXTRACTION W/PHACO Right 09/19/2016   Procedure: CATARACT EXTRACTION PHACO AND INTRAOCULAR LENS PLACEMENT (IOC) Diabetic  Right eye;  Surgeon: Lockie Mola, MD;  Location: [facility];  Service: Ophthalmology;  Laterality: Right;  Diabetic-oral med   CERVICAL FUSION     for fracture C1 and C2   CHOLECYSTECTOMY     ESOPHAGOGASTRODUODENOSCOPY (EGD) WITH PROPOFOL N/A 05/28/2017   Procedure: ESOPHAGOGASTRODUODENOSCOPY (EGD) WITH PROPOFOL;  Surgeon: Toledo, Boykin Nearing, MD;  Location: [facility];  Service: Gastroenterology;  Laterality: N/A;   JOINT REPLACEMENT Bilateral    knee and  hip   KNEE ARTHROSCOPY     multiple   Morton's neuroma removed from foot     ROTATOR CUFF REPAIR Right    TONSILLECTOMY      Allergies  Allergen Reactions   Amlodipine Swelling    Lower leg edema   Tramadol Nausea Only   Codeine Nausea And Vomiting   Fentanyl Nausea And Vomiting   Hydromorphone Hcl Nausea And Vomiting   Morphine Itching and Nausea And Vomiting    Other reaction(s): Vomiting/ feels like ants under skin   Nucynta [Tapentadol] Other (See Comments)    hallucinations   Statins     Muscle pain    Tegaderm Ag Mesh [Silver] Other (See  Comments)    blisters   Tetracaine Itching   Tetracyclines & Related Rash    Outpatient Encounter Medications as of 08/09/2023  Medication Sig   Acetaminophen 325 MG CAPS Take by mouth.   amLODipine (NORVASC) 5 MG tablet Take by mouth.   aspirin EC 81 MG tablet Take 81 mg by mouth daily. am   Calcium Carbonate-Vit D-Min (CALTRATE 600+D PLUS PO) Take by mouth. am   Calcium Carbonate-Vitamin D 600-200 MG-UNIT TABS Take 1 tablet by mouth daily.   Cholecalciferol (VITAMIN D3) 2000 units TABS Take by mouth daily. am   CINNAMON PO Take 1,000 mg by mouth daily. am   Coenzyme Q10 (COQ10) 200 MG CAPS Take by mouth. Dinner   Cranberry-Cholecalciferol 4200-500 MG-UNIT CAPS Take by mouth daily. dinner   ezetimibe (ZETIA) 10 MG tablet Take by mouth.   ipratropium (ATROVENT) 0.03 % nasal spray Place 2 sprays into both nostrils 2 (two) times daily.   Latanoprost (XALATAN OP) Apply 0.005 % to eye at bedtime. 1 drop per eye hour of sleep   losartan (COZAAR) 50 MG tablet Take by mouth. breakfast   meclizine (ANTIVERT) 25 MG tablet Take 1 tablet (25 mg total) by mouth 3 (three) times daily as needed for dizziness.   metFORMIN (GLUCOPHAGE) 1000 MG tablet Take 1,000 mg by mouth 2 (two) times daily with a meal. Breakfast and dinner   Multiple Vitamins-Minerals (CENTRUM SILVER 50+WOMEN PO) Take by mouth. am   NEOMYCIN-POLYMYXIN-HYDROCORTISONE (CORTISPORIN) 1 % SOLN OTIC solution Apply 1-2 drops to toe BID after soaking   spironolactone (ALDACTONE) 25 MG tablet Take by mouth.   triamcinolone cream (KENALOG) 0.1 % Apply 1 application topically as needed. Apply externally to the affected area twice daily   vitamin B-12 (CYANOCOBALAMIN) 500 MCG tablet Take 500 mcg by mouth daily. dinner   No facility-administered encounter medications on file as of 08/09/2023.    Review of Systems:  Review of Systems  Health Maintenance  Topic Date Due   DEXA SCAN  Never done   DTaP/Tdap/Td (2 - Td or Tdap) 05/12/2020    INFLUENZA VACCINE  01/10/2023   COVID-19 Vaccine (8 - 2024-25 season) 05/03/2023   Medicare Annual Wellness (AWV)  08/22/2023   OPHTHALMOLOGY EXAM  10/18/2023   HEMOGLOBIN A1C  12/29/2023   FOOT EXAM  03/26/2024   Pneumonia Vaccine 21+ Years old  Completed   Zoster Vaccines- Shingrix  Completed   HPV VACCINES  Aged Out    Physical Exam: Vitals:   08/09/23 1316  BP: 134/82  Pulse: 61  Temp: (!) 97.2 F (36.2 C)  SpO2: 99%  Weight: 132 lb (59.9 kg)  Height: 5\' 2"  (1.575 m)   Body mass index is 24.14 kg/m. Physical Exam Physical Exam   VITALS: BP- 134/82 MEASUREMENTS: Weight- 232.  Gen: Well-appearing, NAD CV: Well-perfused, Normal HR Lungs: CTAB Ext: No signs of swelling       Labs reviewed: Basic Metabolic Panel: Recent Labs    12/24/22 0830 01/18/23 1452 07/01/23 0820  NA 140 137 139  K 4.3 3.7 4.5  CL 102 105 100  CO2 30 23 30   GLUCOSE 215* 194* 149*  BUN 20 19 17   CREATININE 1.07* 1.03* 1.03*  CALCIUM 9.7 9.3 9.9  TSH  --   --  2.39   Liver Function Tests: Recent Labs    12/24/22 0830 01/18/23 1452 07/01/23 0820  AST 14 18 16   ALT 8 12 9   ALKPHOS  --  61  --   BILITOT 0.5 0.9 0.6  PROT 6.4 6.9 7.2  ALBUMIN  --  3.8  --    No results for input(s): "LIPASE", "AMYLASE" in the last 8760 hours. No results for input(s): "AMMONIA" in the last 8760 hours. CBC: Recent Labs    12/24/22 0830 01/18/23 1452 07/01/23 0820  WBC 7.9 8.3 7.1  NEUTROABS 3,974 4.6  --   HGB 12.1 12.7 13.7  HCT 37.6 38.6 43.1  MCV 92.6 91.5 92.3  PLT 213 242 230   Lipid Panel: Recent Labs    07/01/23 0820  CHOL 200*  HDL 55  LDLCALC 120*  TRIG 136  CHOLHDL 3.6   Lab Results  Component Value Date   HGBA1C 8.3 (H) 07/01/2023    Procedures since last visit: No results found. Results   LABS Blood Glucose: 300 mg/dL (03/47/4259) DGL8V: 5.6% (07/09/2023)       Assessment/Plan There are no diagnoses linked to this encounter. Assessment and Plan     Hyperglycemia Iram experienced extreme fatigue and weakness, with a blood glucose level of 300 mg/dL. She denies missing any doses of her diabetes medication and reports regular intake. Her last HbA1c was 8.3%, indicating suboptimal control. Discussed the importance of maintaining better glycemic control to prevent complications. - Arrange for the nurse to check blood glucose levels regularly - Consider adding a medication if blood glucose levels remain elevated  Diabetes Mellitus Type 2 Ruchi's diabetes is not well-controlled, evidenced by her HbA1c of 8.3%. The goal is to keep HbA1c below 8%. Advised on dietary changes to help manage diabetes. Discussed the option of a pill pack for medication delivery, but Lateria prefers managing her own medications. - Continue current diabetes medications - Reinforce dietary changes to improve glycemic control - Monitor HbA1c levels regularly  General Health Maintenance Maleea plans to cancel her upcoming wellness visit with [facility] due to logistical challenges. Advised her to notify [facility] to free up the appointment slot for another patient. - Cancel the wellness visit with [facility] scheduled for August 27, 2023  Follow-up - Follow-up appointment on October 02, 2023.          Labs/tests ordered:  * No order type specified * Next appt:  10/02/2023

## 2023-08-10 ENCOUNTER — Encounter: Payer: Self-pay | Admitting: Student

## 2023-08-10 DIAGNOSIS — N3000 Acute cystitis without hematuria: Secondary | ICD-10-CM

## 2023-08-11 ENCOUNTER — Encounter: Payer: Self-pay | Admitting: Student

## 2023-08-11 DIAGNOSIS — E0822 Diabetes mellitus due to underlying condition with diabetic chronic kidney disease: Secondary | ICD-10-CM | POA: Insufficient documentation

## 2023-08-13 ENCOUNTER — Telehealth: Payer: Self-pay

## 2023-08-13 NOTE — Telephone Encounter (Signed)
 Victorino Dike called back asking if I had heard back from Dr.Beamer, aware I will call once I hear back

## 2023-08-13 NOTE — Telephone Encounter (Signed)
 Per Victorino Dike patient feels the same but emphasized patient is really exhausted. Appointment scheduled with Shanda Bumps for 11 am on Thursday and if Dr.Beamer feels otherwise, an alternative action can be take at that time

## 2023-08-13 NOTE — Telephone Encounter (Signed)
 Has there been any changes in symptoms from when she was seen by Dr Sydnee Cabal on Friday?

## 2023-08-13 NOTE — Telephone Encounter (Signed)
 Incoming call received from Rodanthe (care coordinator) with [facility] stating patient was seen Friday by Dr.Beamer and would like to be seen tomorrow if possible  (no available appointments).   Per Victorino Dike, patient is exhausted, nauseated, and overall not feeling well. Patients blood sugars are ok (no readings provided). I emphasized to Victorino Dike that calling first thing in the morning increases the chances of patient being seen on a Tuesday as Abbey Chatters, NP had plenty of openings.   I offered appointment at [facility], Office and [place] stating " Patient does not drive.'  Victorino Dike is asking that I call her back at [phone]  Please advise

## 2023-08-15 ENCOUNTER — Encounter: Payer: Self-pay | Admitting: Nurse Practitioner

## 2023-08-15 ENCOUNTER — Ambulatory Visit: Payer: MEDICARE | Admitting: Nurse Practitioner

## 2023-08-15 VITALS — BP 136/78 | HR 71 | Temp 97.8°F | Ht 62.0 in | Wt 130.0 lb

## 2023-08-15 DIAGNOSIS — G301 Alzheimer's disease with late onset: Secondary | ICD-10-CM | POA: Diagnosis not present

## 2023-08-15 DIAGNOSIS — E1165 Type 2 diabetes mellitus with hyperglycemia: Secondary | ICD-10-CM | POA: Diagnosis not present

## 2023-08-15 DIAGNOSIS — R8290 Unspecified abnormal findings in urine: Secondary | ICD-10-CM

## 2023-08-15 DIAGNOSIS — F02A0 Dementia in other diseases classified elsewhere, mild, without behavioral disturbance, psychotic disturbance, mood disturbance, and anxiety: Secondary | ICD-10-CM

## 2023-08-15 DIAGNOSIS — R5383 Other fatigue: Secondary | ICD-10-CM | POA: Diagnosis not present

## 2023-08-15 LAB — POCT URINALYSIS DIPSTICK
Glucose, UA: POSITIVE — AB
Nitrite, UA: POSITIVE
Protein, UA: POSITIVE — AB
Spec Grav, UA: 1.02 (ref 1.010–1.025)
Urobilinogen, UA: NEGATIVE U/dL — AB
pH, UA: 5 (ref 5.0–8.0)

## 2023-08-15 LAB — GLUCOSE, POCT (MANUAL RESULT ENTRY): POC Glucose: 303 mg/dL — AB (ref 70–99)

## 2023-08-15 NOTE — Telephone Encounter (Signed)
 Glucose in clinic was 303, she was confused about year and day. Touched base with Child psychotherapist and they are going to look into having someone pop in to make sure she is taking medication correctly

## 2023-08-15 NOTE — Telephone Encounter (Signed)
 Kristin Mealing, MD  You; Sharon Seller, NP12 hours ago (8:03 PM)   VB Tomorrow morning is the best option. Based on message received from daughter, concern for patient's decline living alone. Kristin Deleon be a good candidate for AL.

## 2023-08-15 NOTE — Progress Notes (Signed)
 Careteam: Patient Care Team: Earnestine Mealing, MD as PCP - General (Family Medicine) Pa,  Eye Care (Optometry) PLACE OF SERVICE:  [facility]   Advanced Directive information    Allergies  Allergen Reactions   Amlodipine Swelling    Lower leg edema   Tramadol Nausea Only   Codeine Nausea And Vomiting   Fentanyl Nausea And Vomiting   Hydromorphone Hcl Nausea And Vomiting   Morphine Itching and Nausea And Vomiting    Other reaction(s): Vomiting/ feels like ants under skin   Nucynta [Tapentadol] Other (See Comments)    hallucinations   Statins     Muscle pain    Tegaderm Ag Mesh [Silver] Other (See Comments)    blisters   Tetracaine Itching   Tetracyclines & Related Rash    Chief Complaint  Patient presents with   Fatigue    Fatigue and Nausea.     Discussed the use of AI scribe software for clinical note transcription with the patient, who gave verbal consent to proceed.  History of Present Illness   Kristin Deleon is a 89 year old female for follow up on fatigue and nausea.  Three days ago, she experienced severe fatigue and nausea, describing the fatigue as the most severe she has ever experienced, making even turning over in bed a major effort. During this episode, she was unable to get out of bed due to extreme tiredness. These symptoms have completely resolved, and she currently feels fine, having resumed her usual activities.  Her diabetes management is a concern as her blood sugar levels have been running high, with a recent reading of 303 mg/dL. Last A1c 8.3. she followed up with Beamer last week. She takes metformin twice a day and takes all her medication out of her bottles daily to take them, a practice she has maintained for three years. Despite this, there is concern about her remembering to take her medications consistently, as she sometimes forgets the day of the week and the year. She prepares her meals independently and has an aide,  Patsy, who assists her twice a week with shopping and household tasks.  No current fatigue, nausea, pain during urination, shortness of breath, cough, congestion, chest pain, low blood sugar, or shakiness. Her urine was noted to be abnormal (leukocytes), but she denies feeling any symptoms of infection.  She lives independently and does not have a car, relying on transportation services for appointments and her aide for grocery shopping. 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 She does not frequently use the dining room for meals and prefers to cook for herself.     Review of Systems:  Review of Systems  Constitutional:  Negative for chills, fever and weight loss.  HENT:  Negative for tinnitus.   Respiratory:  Negative for cough, sputum production and shortness of breath.   Cardiovascular:  Negative for chest pain, palpitations and leg swelling.  Gastrointestinal:  Negative for abdominal pain, constipation, diarrhea and heartburn.  Genitourinary:  Negative for dysuria, frequency and urgency.  Musculoskeletal:  Negative for back pain, falls, joint pain and myalgias.  Skin: Negative.   Neurological:  Negative for dizziness and headaches.  Psychiatric/Behavioral:  Positive for memory loss. Negative for depression. The patient does not have insomnia.     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  Diabetes mellitus without complication (HCC)    metformin-oral meds   HOH (hard of hearing)    hearing aids/ bilateral   Hypercholesteremia    Hypertension    moderate/ controlled on meds   Meniere syndrome    PONV (postoperative nausea and vomiting)    difficulty waking up   Past Surgical History:  Procedure Laterality Date   ABDOMINAL HYSTERECTOMY     CATARACT EXTRACTION     CATARACT EXTRACTION W/PHACO Right 09/19/2016   Procedure: CATARACT EXTRACTION PHACO  AND INTRAOCULAR LENS PLACEMENT (IOC) Diabetic  Right eye;  Surgeon: Lockie Mola, MD;  Location: [facility];  Service: Ophthalmology;  Laterality: Right;  Diabetic-oral med   CERVICAL FUSION     for fracture C1 and C2   CHOLECYSTECTOMY     ESOPHAGOGASTRODUODENOSCOPY (EGD) WITH PROPOFOL N/A 05/28/2017   Procedure: ESOPHAGOGASTRODUODENOSCOPY (EGD) WITH PROPOFOL;  Surgeon: Toledo, Boykin Nearing, MD;  Location: [facility];  Service: Gastroenterology;  Laterality: N/A;   JOINT REPLACEMENT Bilateral    knee and hip   KNEE ARTHROSCOPY     multiple   Morton's neuroma removed from foot     ROTATOR CUFF REPAIR Right    TONSILLECTOMY     Social History:   reports that she has never smoked. She has never used smokeless tobacco. She reports current alcohol use of about 2.0 standard drinks of alcohol per week. She reports that she does not use drugs.  Family History  Problem Relation Age of Onset   Hypertension Sister    Diabetes Sister    Thyroid disease Sister    Bipolar disorder Son    Diabetes Maternal Grandmother     Medications: Patient's Medications  New Prescriptions   No medications on file  Previous Medications   ACETAMINOPHEN 325 MG CAPS    Take by mouth.   AMLODIPINE (NORVASC) 5 MG TABLET    Take by mouth.   ASPIRIN EC 81 MG TABLET    Take 81 mg by mouth daily. am   CALCIUM CARBONATE-VIT D-MIN (CALTRATE 600+D PLUS PO)    Take by mouth. am   CALCIUM CARBONATE-VITAMIN D 600-200 MG-UNIT TABS    Take 1 tablet by mouth daily.   CHOLECALCIFEROL (VITAMIN D3) 2000 UNITS TABS    Take by mouth daily. am   CINNAMON PO    Take 1,000 mg by mouth daily. am   COENZYME Q10 (COQ10) 200 MG CAPS    Take by mouth. Dinner   CRANBERRY-CHOLECALCIFEROL 4200-500 MG-UNIT CAPS    Take by mouth daily. dinner   EZETIMIBE (ZETIA) 10 MG TABLET    Take by mouth.   IPRATROPIUM (ATROVENT) 0.03 % NASAL SPRAY    Place 2 sprays into both nostrils 2 (two) times daily.   LATANOPROST (XALATAN OP)     Apply 0.005 % to eye at bedtime. 1 drop per eye hour of sleep   LOSARTAN (COZAAR) 50 MG TABLET    Take by mouth. breakfast   MECLIZINE (ANTIVERT) 25 MG TABLET    Take 1 tablet (25 mg total) by mouth 3 (three) times daily as needed for dizziness.   METFORMIN (GLUCOPHAGE) 1000 MG TABLET    Take 1,000 mg by mouth 2 (two) times daily with a meal. Breakfast and dinner   MULTIPLE VITAMINS-MINERALS (CENTRUM SILVER 50+WOMEN PO)    Take by mouth. am   NEOMYCIN-POLYMYXIN-HYDROCORTISONE (CORTISPORIN) 1 % SOLN OTIC SOLUTION    Apply 1-2 drops to toe BID after soaking   SPIRONOLACTONE (ALDACTONE) 25 MG TABLET  Take by mouth.   TRIAMCINOLONE CREAM (KENALOG) 0.1 %    Apply 1 application topically as needed. Apply externally to the affected area twice daily   VITAMIN B-12 (CYANOCOBALAMIN) 500 MCG TABLET    Take 500 mcg by mouth daily. dinner  Modified Medications   No medications on file  Discontinued Medications   No medications on file    Physical Exam:  Vitals:   08/15/23 1059  BP: 136/78  Pulse: 71  Temp: 97.8 F (36.6 C)  SpO2: 97%  Weight: 130 lb (59 kg)  Height: 5\' 2"  (1.575 m)   Body mass index is 23.78 kg/m. Wt Readings from Last 3 Encounters:  08/15/23 130 lb (59 kg)  08/09/23 132 lb (59.9 kg)  06/28/23 132 lb (59.9 kg)    Physical Exam Constitutional:      General: She is not in acute distress.    Appearance: She is well-developed. She is not diaphoretic.  HENT:     Head: Normocephalic and atraumatic.     Mouth/Throat:     Pharynx: No oropharyngeal exudate.  Eyes:     Conjunctiva/sclera: Conjunctivae normal.     Pupils: Pupils are equal, round, and reactive to light.  Cardiovascular:     Rate and Rhythm: Normal rate and regular rhythm.     Heart sounds: Normal heart sounds.  Pulmonary:     Effort: Pulmonary effort is normal.     Breath sounds: Normal breath sounds.  Abdominal:     General: Bowel sounds are normal.     Palpations: Abdomen is soft.  Musculoskeletal:      Cervical back: Normal range of motion and neck supple.     Right lower leg: No edema.     Left lower leg: No edema.  Skin:    General: Skin is warm and dry.  Neurological:     Mental Status: She is alert. She is disoriented.     Comments: Oriented to place and person  Psychiatric:        Mood and Affect: Mood normal.     Labs reviewed: Basic Metabolic Panel: Recent Labs    12/24/22 0830 01/18/23 1452 07/01/23 0820  NA 140 137 139  K 4.3 3.7 4.5  CL 102 105 100  CO2 30 23 30   GLUCOSE 215* 194* 149*  BUN 20 19 17   CREATININE 1.07* 1.03* 1.03*  CALCIUM 9.7 9.3 9.9  TSH  --   --  2.39   Liver Function Tests: Recent Labs    12/24/22 0830 01/18/23 1452 07/01/23 0820  AST 14 18 16   ALT 8 12 9   ALKPHOS  --  61  --   BILITOT 0.5 0.9 0.6  PROT 6.4 6.9 7.2  ALBUMIN  --  3.8  --    No results for input(s): "LIPASE", "AMYLASE" in the last 8760 hours. No results for input(s): "AMMONIA" in the last 8760 hours. CBC: Recent Labs    12/24/22 0830 01/18/23 1452 07/01/23 0820  WBC 7.9 8.3 7.1  NEUTROABS 3,974 4.6  --   HGB 12.1 12.7 13.7  HCT 37.6 38.6 43.1  MCV 92.6 91.5 92.3  PLT 213 242 230   Lipid Panel: Recent Labs    07/01/23 0820  CHOL 200*  HDL 55  LDLCALC 120*  TRIG 136  CHOLHDL 3.6   TSH: Recent Labs    07/01/23 0820  TSH 2.39   A1C: Lab Results  Component Value Date   HGBA1C 8.3 (H) 07/01/2023  Assessment/Plan Assessment and Plan    Diabetes Mellitus High blood glucose (303) despite reported adherence to Metformin. Possible medication non-adherence due to cognitive impairment. -Consider social worker involvement to assist with medication management. -Bring medication bottles to next appointment for review. Unsure if she is taking her blood sugars at home.  -recommending also having pop in service offer at [facility] to help make sure you are taking medication as prescribed.   Dementia  Difficulty with orientation to time and  potential medication non-adherence. -remains very independent  -continues to be followed with SW at [facility]  Fatigue Resolved. No current complaints.  Abnormal UA UA showing leukocytes  -Send urine for culture and sensitivity. -Consider treatment based on results.     Kristin Deleon. Biagio Borg  [facility] [phone]

## 2023-08-15 NOTE — Patient Instructions (Addendum)
 Glad you are feeling better today   We will send your urine off for culture due to leukocytes found in the urine may indicate and infection.   recommend looking over your medication to make sure you have all the pill bottles of your prescribed medication Recommend using a pill pack or pill box to organize your medications.

## 2023-08-16 ENCOUNTER — Other Ambulatory Visit: Payer: Self-pay | Admitting: Student

## 2023-08-16 DIAGNOSIS — N3000 Acute cystitis without hematuria: Secondary | ICD-10-CM

## 2023-08-16 DIAGNOSIS — G301 Alzheimer's disease with late onset: Secondary | ICD-10-CM

## 2023-08-16 MED ORDER — NITROFURANTOIN MONOHYD MACRO 100 MG PO CAPS: 100.0000 mg | ORAL_CAPSULE | Freq: Two times a day (BID) | ORAL | 0 refills | Status: DC

## 2023-08-16 NOTE — Progress Notes (Signed)
 Rx for antibiotics to pharmacy.

## 2023-08-16 NOTE — Addendum Note (Signed)
 Addended by: Earnestine Mealing on: 08/16/2023 05:26 PM   Modules accepted: Orders

## 2023-08-16 NOTE — Telephone Encounter (Signed)
 Patient's urine culture pending, however, given the weekend and inability to drive, will send prescription for urinary tract infection. Creatinine Clearance 32, will give normal dosing of macrobid 100 mg BID for 7 days for UTI treatment.   No previous cultures, however urinalysis with nitrites, LE, WBC, many bacteria present.   Adjust medications as indicated with culture results.

## 2023-08-17 LAB — URINE CULTURE
MICRO NUMBER:: 16167946
SPECIMEN QUALITY:: ADEQUATE

## 2023-08-17 LAB — MICROSCOPIC MESSAGE

## 2023-08-17 LAB — URINALYSIS, ROUTINE W REFLEX MICROSCOPIC
Bilirubin Urine: NEGATIVE
Hgb urine dipstick: NEGATIVE
Ketones, ur: NEGATIVE
Nitrite: POSITIVE — AB
RBC / HPF: NONE SEEN /HPF (ref 0–2)
Specific Gravity, Urine: 1.023 (ref 1.001–1.035)
WBC, UA: >=60 /HPF — AB (ref 0–5)
pH: <=5 (ref 5.0–8.0)

## 2023-08-19 ENCOUNTER — Other Ambulatory Visit: Payer: Self-pay | Admitting: Nurse Practitioner

## 2023-08-19 NOTE — Progress Notes (Unsigned)
 Just saw Dr Sydnee Cabal has already send in macroBID, sensitivity to this. To take entire course.

## 2023-08-20 NOTE — Telephone Encounter (Signed)
 Patient is requesting refill on antibiotic. Medication pend and sent to PCP Earnestine Mealing, MD for approval.

## 2023-09-02 ENCOUNTER — Ambulatory Visit: Payer: MEDICARE | Admitting: Student

## 2023-09-02 VITALS — BP 130/84 | HR 78 | Temp 98.1°F | Resp 16

## 2023-09-02 DIAGNOSIS — N390 Urinary tract infection, site not specified: Secondary | ICD-10-CM

## 2023-09-02 DIAGNOSIS — F333 Major depressive disorder, recurrent, severe with psychotic symptoms: Secondary | ICD-10-CM

## 2023-09-02 DIAGNOSIS — R410 Disorientation, unspecified: Secondary | ICD-10-CM | POA: Diagnosis not present

## 2023-09-02 DIAGNOSIS — G301 Alzheimer's disease with late onset: Secondary | ICD-10-CM

## 2023-09-02 DIAGNOSIS — F02A0 Dementia in other diseases classified elsewhere, mild, without behavioral disturbance, psychotic disturbance, mood disturbance, and anxiety: Secondary | ICD-10-CM

## 2023-09-02 MED ORDER — ARIPIPRAZOLE 5 MG PO TABS
5.0000 mg | ORAL_TABLET | Freq: Every day | ORAL | 2 refills | Status: DC
Start: 2023-09-02 — End: 2023-09-06

## 2023-09-02 MED ORDER — SULFAMETHOXAZOLE-TRIMETHOPRIM 800-160 MG PO TABS: 1.0000 | ORAL_TABLET | Freq: Two times a day (BID) | ORAL | 0 refills | Status: AC

## 2023-09-02 MED ORDER — SERTRALINE HCL 50 MG PO TABS: ORAL_TABLET | ORAL | 0 refills | Status: DC

## 2023-09-02 NOTE — Progress Notes (Unsigned)
 [facility]  Provider: Sydnee Cabal  Code Status: DNR Goals of Care:     06/28/2023    1:37 PM  Advanced Directives  Does Patient Have a Medical Advance Directive? No  Would patient like information on creating a medical advance directive? No - Patient declined     No chief complaint on file.   HPI: Patient is a 89 y.o. female seen today for an acute visit for Discussed the use of AI scribe software for clinical note transcription with the patient, who gave verbal consent to proceed.  History of Present Illness   Kristin Deleon is a 89 year old female who presents with confusion and memory issues related to the anniversary of her sister's death.  She is experiencing confusion and memory issues, particularly related to the death of her sister, which she believes occurred recently, although it actually happened seven years ago. She has been upset about her sister's death, and her daughter has tried to reorient her to the correct timeline. She acknowledged to her son that it was the anniversary of her sister's death, but she continues to express that it feels recent.  She reports that her nephew was involved in her sister's death, describing him as schizophrenic and prone to anger. She recalls a vivid memory of her sister's death, including details of phone calls and interactions with her other sister, which she now questions as possibly being a vivid dream.  She is currently living alone and manages her own meals and medications. She prepares her meals and takes her medications as prescribed, including metformin and eye drops. She has an aide who assists her twice a week for grocery shopping and other tasks. She feels lonely at times, as she does not participate in social activities like bridge, which is popular among her peers. She maintains contact with a long-time friend in [place] and her son, who is visiting soon. She lacks interest in moving to assisted  living, fearing it would lead to rapid deterioration.  No chest pain or shortness of breath. She wakes up at night to use the bathroom but otherwise sleeps well. She is able to recall the current month and year with some prompting, although she initially stated the incorrect year.   Spoke to patient's daughter who states she delivers food to her mother once a week for many months. Patient has incorrectly taken medications for over 1 year. Concern for poor adherence which could be contributing to the presentation today. Agrees with having her transferred to a higher level of care until she completes antibiotics for further monitoring and support.     Sherrilyn Rist spoke to her mom yesterday morning and she thought it was her sister 12-23-2023 who died. It was a tragic situation. The son passed away 3wks after she died. He was admitted to psych after she passed (blunt force trauma). Brother called her in the afternoon and said it was the anniversary of her sister's death. Continued concern for her lack of safety awareness. Would prefer [facility] for respite and/or HH assistance to remain in the home if possible. She has remained actively involved in [facility] care with weekly meal delivery, etc. Agrees with a medication management system even though patient may not be.    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   Diabetes mellitus without complication (HCC)    metformin-oral meds   HOH (hard of hearing)  hearing aids/ bilateral   Hypercholesteremia    Hypertension    moderate/ controlled on meds   Meniere syndrome    PONV (postoperative nausea and vomiting)    difficulty waking up    Past Surgical History:  Procedure Laterality Date   ABDOMINAL HYSTERECTOMY     CATARACT EXTRACTION     CATARACT EXTRACTION W/PHACO Right 09/19/2016   Procedure: CATARACT EXTRACTION PHACO AND INTRAOCULAR LENS PLACEMENT (IOC) Diabetic  Right eye;  Surgeon: Lockie Mola, MD;  Location:  [facility];  Service: Ophthalmology;  Laterality: Right;  Diabetic-oral med   CERVICAL FUSION     for fracture C1 and C2   CHOLECYSTECTOMY     ESOPHAGOGASTRODUODENOSCOPY (EGD) WITH PROPOFOL N/A 05/28/2017   Procedure: ESOPHAGOGASTRODUODENOSCOPY (EGD) WITH PROPOFOL;  Surgeon: Toledo, Boykin Nearing, MD;  Location: [facility];  Service: Gastroenterology;  Laterality: N/A;   JOINT REPLACEMENT Bilateral    knee and hip   KNEE ARTHROSCOPY     multiple   Morton's neuroma removed from foot     ROTATOR CUFF REPAIR Right    TONSILLECTOMY      Allergies  Allergen Reactions   Amlodipine Swelling    Lower leg edema   Tramadol Nausea Only   Codeine Nausea And Vomiting   Fentanyl Nausea And Vomiting   Hydromorphone Hcl Nausea And Vomiting   Morphine Itching and Nausea And Vomiting    Other reaction(s): Vomiting/ feels like ants under skin   Nucynta [Tapentadol] Other (See Comments)    hallucinations   Statins     Muscle pain    Tegaderm Ag Mesh [Silver] Other (See Comments)    blisters   Tetracaine Itching   Tetracyclines & Related Rash    Outpatient Encounter Medications as of 09/02/2023  Medication Sig   ARIPiprazole (ABILIFY) 5 MG tablet Take 1 tablet (5 mg total) by mouth daily.   sulfamethoxazole-trimethoprim (BACTRIM DS) 800-160 MG tablet Take 1 tablet by mouth 2 (two) times daily for 10 days.   [DISCONTINUED] sertraline (ZOLOFT) 50 MG tablet Take 0.5 tablets (25 mg total) by mouth daily for 5 days, THEN 1 tablet (50 mg total) daily for 5 days, THEN 2 tablets (100 mg total) daily.   Acetaminophen 325 MG CAPS Take by mouth.   amLODipine (NORVASC) 5 MG tablet Take by mouth.   aspirin EC 81 MG tablet Take 81 mg by mouth daily. am   Calcium Carbonate-Vit D-Min (CALTRATE 600+D PLUS PO) Take by mouth. am   Calcium Carbonate-Vitamin D 600-200 MG-UNIT TABS Take 1 tablet by mouth daily.   Cholecalciferol (VITAMIN D3) 2000 units TABS Take by mouth daily. am   CINNAMON PO Take 1,000  mg by mouth daily. am   Coenzyme Q10 (COQ10) 200 MG CAPS Take by mouth. Dinner   Cranberry-Cholecalciferol 4200-500 MG-UNIT CAPS Take by mouth daily. dinner   ezetimibe (ZETIA) 10 MG tablet Take by mouth.   ipratropium (ATROVENT) 0.03 % nasal spray Place 2 sprays into both nostrils 2 (two) times daily.   Latanoprost (XALATAN OP) Apply 0.005 % to eye at bedtime. 1 drop per eye hour of sleep   losartan (COZAAR) 50 MG tablet Take by mouth. breakfast   meclizine (ANTIVERT) 25 MG tablet Take 1 tablet (25 mg total) by mouth 3 (three) times daily as needed for dizziness.   metFORMIN (GLUCOPHAGE) 1000 MG tablet Take 1,000 mg by mouth 2 (two) times daily with a meal. Breakfast and dinner   Multiple Vitamins-Minerals (CENTRUM SILVER 50+WOMEN PO) Take by mouth.  am   NEOMYCIN-POLYMYXIN-HYDROCORTISONE (CORTISPORIN) 1 % SOLN OTIC solution Apply 1-2 drops to toe BID after soaking   spironolactone (ALDACTONE) 25 MG tablet Take by mouth.   triamcinolone cream (KENALOG) 0.1 % Apply 1 application topically as needed. Apply externally to the affected area twice daily   vitamin B-12 (CYANOCOBALAMIN) 500 MCG tablet Take 500 mcg by mouth daily. dinner   No facility-administered encounter medications on file as of 09/02/2023.    Review of Systems:  Review of Systems  Health Maintenance  Topic Date Due   DEXA SCAN  Never done   DTaP/Tdap/Td (2 - Td or Tdap) 05/12/2020   INFLUENZA VACCINE  01/10/2023   COVID-19 Vaccine (8 - 2024-25 season) 05/03/2023   Medicare Annual Wellness (AWV)  08/22/2023   OPHTHALMOLOGY EXAM  10/18/2023   HEMOGLOBIN A1C  12/29/2023   FOOT EXAM  03/26/2024   Pneumonia Vaccine 22+ Years old  Completed   Zoster Vaccines- Shingrix  Completed   HPV VACCINES  Aged Out    Physical Exam: There were no vitals filed for this visit. There is no height or weight on file to calculate BMI. Physical Exam Constitutional:      Appearance: Normal appearance.  Cardiovascular:     Rate and  Rhythm: Normal rate and regular rhythm.     Pulses: Normal pulses.     Heart sounds: Normal heart sounds.  Pulmonary:     Effort: Pulmonary effort is normal.  Abdominal:     General: Abdomen is flat. Bowel sounds are normal.     Palpations: Abdomen is soft.  Musculoskeletal:        General: No swelling or tenderness.  Skin:    General: Skin is warm and dry.  Neurological:     General: No focal deficit present.     Mental Status: She is alert. She is disoriented.     Gait: Gait normal.  Psychiatric:        Mood and Affect: Mood normal.    Physical Exam   VITALS: SaO2- 100%       Labs reviewed: Basic Metabolic Panel: Recent Labs    12/24/22 0830 01/18/23 1452 07/01/23 0820  NA 140 137 139  K 4.3 3.7 4.5  CL 102 105 100  CO2 30 23 30   GLUCOSE 215* 194* 149*  BUN 20 19 17   CREATININE 1.07* 1.03* 1.03*  CALCIUM 9.7 9.3 9.9  TSH  --   --  2.39   Liver Function Tests: Recent Labs    12/24/22 0830 01/18/23 1452 07/01/23 0820  AST 14 18 16   ALT 8 12 9   ALKPHOS  --  61  --   BILITOT 0.5 0.9 0.6  PROT 6.4 6.9 7.2  ALBUMIN  --  3.8  --    No results for input(s): "LIPASE", "AMYLASE" in the last 8760 hours. No results for input(s): "AMMONIA" in the last 8760 hours. CBC: Recent Labs    12/24/22 0830 01/18/23 1452 07/01/23 0820  WBC 7.9 8.3 7.1  NEUTROABS 3,974 4.6  --   HGB 12.1 12.7 13.7  HCT 37.6 38.6 43.1  MCV 92.6 91.5 92.3  PLT 213 242 230   Lipid Panel: Recent Labs    07/01/23 0820  CHOL 200*  HDL 55  LDLCALC 120*  TRIG 136  CHOLHDL 3.6   Lab Results  Component Value Date   HGBA1C 8.3 (H) 07/01/2023    Procedures since last visit: No results found. Results   LABS Blood Glucose: elevated (09/02/2023)  Assessment/Plan  Assessment and Plan    Cognitive Impairment  c/f UTI Siomara is experiencing confusion and disorientation, particularly regarding the timeline of her sister's death, which she believes occurred recently rather  than seven years ago. This confusion may be exacerbated by the anniversary of her sister's death and potential underlying cognitive decline. There is concern about her short-term memory and the possibility of a secondary cause for her disorientation, such as a urinary tract infection. Donepezil is considered to help stabilize memory, with the understanding that it may provide some clarity in the order of events within the first three months of use. UA dip in clinic positive for LE, nitrites, WBC. Concern for undertreated UTI given patient's poor medication adherence. CrCl 32.  - Check urine for infection - consider starting donepezil to help stabilize memory - Encourage hydration to facilitate urine collection - Start Bactrim high dose BID x10 days.   Depressive Symptoms Dareen appears to be experiencing depressive symptoms, including feelings of loneliness and a lack of interest in activities. She expresses a sense of purposelessness and has a limited social network, which may contribute to her mood. The importance of socialization is discussed, and Zoloft is considered to improve her mood. In order to keep regimen simple, will start abilify 5 mg daily.  - Discuss the importance of socialization and consider respite care for increased interaction - Monitor mood and safety closely  Social Isolation Ruthetta reports feeling lonely and isolated, with limited social interactions. She is resistant to the idea of moving to assisted living due to concerns about losing independence and being forced into activities she dislikes, such as playing bridge. The potential benefits of assisted living for increased social interaction are discussed, along with exploring alternative social activities that align with her interests. - Discuss the potential benefits of assisted living for increased social interaction - Explore alternative social activities that align with her interests  Diabetes Mellitus Carie's blood sugar  levels are high, indicating poorly controlled diabetes. Caution is exercised in prescribing medications like Zyprexa due to potential adverse effects on her diabetes management. - Continue current diabetes medications - Monitor blood sugar levels closely      I spent greater than 90 minutes for the care of this patient in face to face time, chart review, clinical documentation, patient education,  care coordination  Labs/tests ordered:  * No order type specified * Next appt:  09/11/2023

## 2023-09-02 NOTE — Patient Instructions (Signed)
 INSTRUCTIONS:  Please follow the tapering plan for Zoloft: start with 25 mg for five days, then increase to 50 mg for 5 days then increase to 100 mg daily. Please also start Abilify 5 mg daily.  Stay hydrated to help with urine collection. We will check your urine for infection and monitor your blood sugar levels closely. Consider the potential benefits of assisted living and explore alternative social activities that you might enjoy.

## 2023-09-03 ENCOUNTER — Encounter: Payer: Self-pay | Admitting: Student

## 2023-09-04 ENCOUNTER — Encounter: Payer: Self-pay | Admitting: Student

## 2023-09-04 LAB — URINALYSIS
Bilirubin Urine: NEGATIVE
Glucose, UA: NEGATIVE
Hgb urine dipstick: NEGATIVE
Ketones, ur: NEGATIVE
Nitrite: POSITIVE — AB
Protein, ur: NEGATIVE
Specific Gravity, Urine: 1.022 (ref 1.001–1.035)
pH: 5.5 (ref 5.0–8.0)

## 2023-09-04 LAB — URINE CULTURE
MICRO NUMBER:: 16238824
SPECIMEN QUALITY:: ADEQUATE

## 2023-09-06 ENCOUNTER — Other Ambulatory Visit: Payer: Self-pay | Admitting: Student

## 2023-09-06 DIAGNOSIS — J302 Other seasonal allergic rhinitis: Secondary | ICD-10-CM

## 2023-09-06 DIAGNOSIS — G301 Alzheimer's disease with late onset: Secondary | ICD-10-CM

## 2023-09-06 DIAGNOSIS — E1165 Type 2 diabetes mellitus with hyperglycemia: Secondary | ICD-10-CM

## 2023-09-06 DIAGNOSIS — R42 Dizziness and giddiness: Secondary | ICD-10-CM

## 2023-09-06 DIAGNOSIS — F333 Major depressive disorder, recurrent, severe with psychotic symptoms: Secondary | ICD-10-CM

## 2023-09-06 DIAGNOSIS — H409 Unspecified glaucoma: Secondary | ICD-10-CM

## 2023-09-06 DIAGNOSIS — I10 Essential (primary) hypertension: Secondary | ICD-10-CM

## 2023-09-06 MED ORDER — EZETIMIBE 10 MG PO TABS: 10.0000 mg | ORAL_TABLET | Freq: Every day | ORAL | 3 refills | Status: AC

## 2023-09-06 MED ORDER — VITAMIN B-12 500 MCG PO TABS: 500.0000 ug | ORAL_TABLET | Freq: Every day | ORAL | 1 refills | Status: AC

## 2023-09-06 MED ORDER — METFORMIN HCL 1000 MG PO TABS: 1000.0000 mg | ORAL_TABLET | Freq: Two times a day (BID) | ORAL | 2 refills | Status: DC

## 2023-09-06 MED ORDER — LATANOPROST 0.005 % OP SOLN: 1.0000 [drp] | Freq: Every day | OPHTHALMIC | 1 refills | Status: AC

## 2023-09-06 MED ORDER — ARIPIPRAZOLE 5 MG PO TABS: 5.0000 mg | ORAL_TABLET | Freq: Every evening | ORAL | 2 refills | Status: AC

## 2023-09-06 NOTE — Progress Notes (Signed)
 Patient is transitioning to pill packs for medication management. Meds sent to pharmacy which delivers on campus. Discontinuing medications that she is not taking and are no longer indicated. BP medications have not been filled since 2023 and BP within goal range.

## 2023-09-11 ENCOUNTER — Encounter: Payer: Self-pay | Admitting: Student

## 2023-09-11 ENCOUNTER — Ambulatory Visit (SKILLED_NURSING_FACILITY): Payer: MEDICARE | Admitting: Student

## 2023-09-11 VITALS — BP 122/74 | HR 74 | Ht 62.0 in | Wt 123.0 lb

## 2023-09-11 DIAGNOSIS — E1165 Type 2 diabetes mellitus with hyperglycemia: Secondary | ICD-10-CM | POA: Diagnosis not present

## 2023-09-11 DIAGNOSIS — R110 Nausea: Secondary | ICD-10-CM | POA: Diagnosis not present

## 2023-09-11 DIAGNOSIS — F02A0 Dementia in other diseases classified elsewhere, mild, without behavioral disturbance, psychotic disturbance, mood disturbance, and anxiety: Secondary | ICD-10-CM | POA: Diagnosis not present

## 2023-09-11 DIAGNOSIS — Z7189 Other specified counseling: Secondary | ICD-10-CM

## 2023-09-11 DIAGNOSIS — G301 Alzheimer's disease with late onset: Secondary | ICD-10-CM

## 2023-09-11 NOTE — Patient Instructions (Signed)
 VISIT SUMMARY:  During your visit, we discussed your persistent nausea and weight loss, memory changes, diabetes management, and goals of care. We also talked about your living situation and the importance of socialization.  YOUR PLAN:  -MEMORY CHANGES: You have been experiencing memory changes and confusion over the past five years, with some recent improvement. To ensure your safety and medication adherence, we discussed the possibility of moving to assisted living and the importance of socialization. Starting next week, you will use pill packs to help manage your medications.  -NAUSEA: Your persistent nausea is likely due to a recent antibiotic. To help manage this, we will discontinue the antibiotic if you haven't already finished it, monitor your weight and dietary intake, and encourage you to eat small, frequent meals.  -DIABETES MELLITUS: Your diabetes has shown improvement with better medication adherence. We will continue to monitor your blood sugar levels and ensure you take your medications consistently with the new pill pack system.  -GOALS OF CARE: You have expressed a desire not to return to the hospital and are open to palliative care options. We provided you with a MOST form to document your care preferences and encouraged you to discuss these with your healthcare power of attorney.  INSTRUCTIONS:  Please schedule a follow-up appointment for next Friday to evaluate the new medication management system and continue our discussion on care preferences and living arrangements.

## 2023-09-11 NOTE — Progress Notes (Signed)
 [facility] Provider: Sydnee Cabal  Code Status: DNR Goals of Care:     09/11/2023    2:23 PM  Advanced Directives  Does Patient Have a Medical Advance Directive? Yes  Type of Advance Directive Out of facility DNR (pink MOST or yellow form)  Does patient want to make changes to medical advance directive? No - Patient declined     Chief Complaint  Patient presents with   Medical Management of Chronic Issues    Medical Management of Chronic Issues. 2 week follow up. To discuss Tdap,Covid, Dexa and AWV    HPI: Patient is a 89 y.o. female seen today for medical management of chronic diseases.   Discussed the use of AI scribe software for clinical note transcription with the patient, who gave verbal consent to proceed. History of Present Illness  History of Present Illness Kristin Deleon is a 89 year old female who presents with persistent nausea and weight loss.  She experiences persistent nausea, particularly triggered by certain smells, which she associates with a new antibiotic she is nearly finished with. Despite the nausea, there are no episodes of vomiting or diarrhea. She has lost seven pounds over the past month, attributing this to a decreased appetite due to the nausea. She states, 'I don't feel like eating,' but confirms she has been eating, mentioning a recent meal of canned vegetable soup.  She has a history of medication management issues, with caregivers assisting her in organizing and ensuring she takes her medications correctly. She finds the oversight irritating, stating, 'I know how to do that.' She confirms taking her morning and evening medications as prescribed.  She has been living at [facility] for approximately five years, having moved there after selling her house in [place] following her husband's death. Her daughter and son facilitated the move to be closer to family. She previously lived in a house with an indoor pool, which she  used for exercise, and expresses regret over losing that amenity. She reports a lack of purpose and motivation over the past year, stating, 'I don't care about anything.' She attributes this to changes in her life, including the loss of independence and social connections, such as her involvement in church activities and driving, which ceased after knee surgery in August 2023.  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   Diabetes mellitus without complication (HCC)    metformin-oral meds   HOH (hard of hearing)    hearing aids/ bilateral   Hypercholesteremia    Hypertension    moderate/ controlled on meds   Meniere syndrome    PONV (postoperative nausea and vomiting)    difficulty waking up    Past Surgical History:  Procedure Laterality Date   ABDOMINAL HYSTERECTOMY     CATARACT EXTRACTION     CATARACT EXTRACTION W/PHACO Right 09/19/2016   Procedure: CATARACT EXTRACTION PHACO AND INTRAOCULAR LENS PLACEMENT (IOC) Diabetic  Right eye;  Surgeon: Lockie Mola, MD;  Location: [facility];  Service: Ophthalmology;  Laterality: Right;  Diabetic-oral med   CERVICAL FUSION     for fracture C1 and C2   CHOLECYSTECTOMY     ESOPHAGOGASTRODUODENOSCOPY (EGD) WITH PROPOFOL N/A 05/28/2017   Procedure: ESOPHAGOGASTRODUODENOSCOPY (EGD) WITH PROPOFOL;  Surgeon: Toledo, Boykin Nearing, MD;  Location: [facility];  Service: Gastroenterology;  Laterality: N/A;   JOINT REPLACEMENT Bilateral    knee and hip   KNEE ARTHROSCOPY     multiple   Morton's neuroma removed from foot     ROTATOR CUFF REPAIR Right    TONSILLECTOMY      Allergies  Allergen Reactions   Amlodipine Swelling    Lower leg edema   Tramadol Nausea Only   Codeine Nausea And Vomiting   Fentanyl Nausea And Vomiting   Hydromorphone Hcl Nausea And Vomiting   Morphine Itching and  Nausea And Vomiting    Other reaction(s): Vomiting/ feels like ants under skin   Nucynta [Tapentadol] Other (See Comments)    hallucinations   Statins     Muscle pain    Tegaderm Ag Mesh [Silver] Other (See Comments)    blisters   Tetracaine Itching   Tetracyclines & Related Rash    Outpatient Encounter Medications as of 09/11/2023  Medication Sig   Acetaminophen 325 MG CAPS Take by mouth.   ARIPiprazole (ABILIFY) 5 MG tablet Take 1 tablet (5 mg total) by mouth at bedtime.   ezetimibe (ZETIA) 10 MG tablet Take 1 tablet (10 mg total) by mouth daily.   ipratropium (ATROVENT) 0.03 % nasal spray Place 2 sprays into both nostrils 2 (two) times daily.   latanoprost (XALATAN) 0.005 % ophthalmic solution Place 1 drop into both eyes at bedtime. 1 drop per eye hour of sleep   meclizine (ANTIVERT) 25 MG tablet Take 1 tablet (25 mg total) by mouth 3 (three) times daily as needed for dizziness.   metFORMIN (GLUCOPHAGE) 1000 MG tablet Take 1 tablet (1,000 mg total) by mouth 2 (two) times daily with a meal. Breakfast and dinner   Multiple Vitamins-Minerals (CENTRUM SILVER 50+WOMEN PO) Take by mouth. am   sulfamethoxazole-trimethoprim (BACTRIM DS) 800-160 MG tablet Take 1 tablet by mouth 2 (two) times daily for 10 days.   triamcinolone cream (KENALOG) 0.1 % Apply 1 application topically as needed. Apply externally to the affected area twice daily   vitamin B-12 (CYANOCOBALAMIN) 500 MCG tablet Take 1 tablet (500 mcg total) by mouth daily. dinner   No facility-administered encounter medications on file as of 09/11/2023.    Review of Systems:  Review of Systems  Health Maintenance  Topic Date Due   DEXA SCAN  Never done   DTaP/Tdap/Td (2 - Td or Tdap) 05/12/2020   COVID-19 Vaccine (8 - 2024-25 season) 05/03/2023   Medicare Annual Wellness (AWV)  08/22/2023   OPHTHALMOLOGY EXAM  10/18/2023   HEMOGLOBIN A1C  12/29/2023   INFLUENZA VACCINE  01/10/2024   FOOT EXAM  03/26/2024   Pneumonia Vaccine 13+  Years old  Completed   Zoster Vaccines- Shingrix  Completed   HPV VACCINES  Aged Out    Physical Exam: Vitals:   09/11/23 1419  BP: 122/74  Pulse: 74  SpO2: 99%  Weight: 123 lb (55.8 kg)  Height: 5\' 2"  (1.575 m)   Body mass index is 22.5 kg/m. Physical Exam Constitutional:      Appearance: Normal appearance.  Cardiovascular:     Rate and Rhythm: Normal rate and regular rhythm.     Pulses: Normal pulses.     Heart sounds: Normal heart sounds.  Pulmonary:     Effort: Pulmonary effort is normal.  Abdominal:     General: Abdomen is flat. Bowel sounds are normal.     Palpations: Abdomen is soft.  Musculoskeletal:        General: No swelling or  tenderness.  Skin:    General: Skin is warm and dry.  Neurological:     Mental Status: She is alert.     Gait: Gait normal.  Psychiatric:        Mood and Affect: Mood normal.     Comments: Appears clean, but slightly disheveled.     Labs reviewed: Basic Metabolic Panel: Recent Labs    12/24/22 0830 01/18/23 1452 07/01/23 0820  NA 140 137 139  K 4.3 3.7 4.5  CL 102 105 100  CO2 30 23 30   GLUCOSE 215* 194* 149*  BUN 20 19 17   CREATININE 1.07* 1.03* 1.03*  CALCIUM 9.7 9.3 9.9  TSH  --   --  2.39   Liver Function Tests: Recent Labs    12/24/22 0830 01/18/23 1452 07/01/23 0820  AST 14 18 16   ALT 8 12 9   ALKPHOS  --  61  --   BILITOT 0.5 0.9 0.6  PROT 6.4 6.9 7.2  ALBUMIN  --  3.8  --    No results for input(s): "LIPASE", "AMYLASE" in the last 8760 hours. No results for input(s): "AMMONIA" in the last 8760 hours. CBC: Recent Labs    12/24/22 0830 01/18/23 1452 07/01/23 0820  WBC 7.9 8.3 7.1  NEUTROABS 3,974 4.6  --   HGB 12.1 12.7 13.7  HCT 37.6 38.6 43.1  MCV 92.6 91.5 92.3  PLT 213 242 230   Lipid Panel: Recent Labs    07/01/23 0820  CHOL 200*  HDL 55  LDLCALC 120*  TRIG 136  CHOLHDL 3.6   Lab Results  Component Value Date   HGBA1C 8.3 (H) 07/01/2023    Procedures since last visit: No  results found. Results   Assessment/Plan Mild Dementia Exhibits memory changes and confusion over the past five years, with recent improvement post-antibiotics. Concerns about safety and medication adherence due to memory issues. Believes she has been living at [facility] for two years, though it has been five years, indicating memory lapses. Socialization is important to prevent further cognitive decline. GOC conversations outlined below. Would benefit from communal living, will continue conversations for these efforts.  - Discuss potential move to assisted living for increased support and safety - Encourage socialization to prevent further cognitive decline - Ensure medication adherence with pill packs starting next week - Pre-packaged medications prepared for delivery today.   Nausea Persistent nausea likely secondary to recent antibiotic use. No vomiting or diarrhea reported. Reports decreased appetite and a 7-pound weight loss over the last month. Constant nausea is affecting dietary intake. - Discontinue current antibiotic if not already completed - Monitor weight and dietary intake - Encourage small, frequent meals to manage nausea - f/u in 1 week, if persistent, consider medication changes for mood management.   Diabetes Mellitus Type 2  Diabetes with previously uncontrolled blood sugars in the 230s, recently improved to 170s, likely due to better medication adherence. Dietary habits and irregular meal timing may affect blood sugar control. - Continue monitoring blood sugar levels - Ensure consistent medication adherence with new pill pack system  Goals of Care Desires not to return to the hospital and is open to palliative care options. Has a DNR order and is considering further documentation of care preferences. Not suicidal but expresses a lack of purpose and willingness to accept end-of-life care. Discussed palliative care as a means to manage symptoms without curative intent.  Provided information on MOST form to document care preferences, including decisions about hospital visits, antibiotics,  IV fluids, and feeding tubes. Discussed concern that the desire to never return to the hospital can only be carried out in higher levels of care I.e. Memory Care and Healthcare. Encouraged discussion with her daughter/HCPOA regarding these preferences. - Provide MOST form to review with healthcare power of attorney - Discuss palliative care options with patient and family - Encourage discussion with healthcare power of attorney regarding care preferences  Follow-up Follow-up is necessary to assess the effectiveness of the new medication management system and to continue discussions on care preferences and living arrangements. - Schedule follow-up appointment next Friday to evaluate new medication management system and discuss care preferences   Labs/tests ordered:  * No order type specified * Next appt:  10/02/2023  I spent greater than 30 minutes for the care of this patient in face to face time, chart review, clinical documentation, patient education. I spent an additional 16 minutes discussing goals of care and advanced care planning.

## 2023-09-13 ENCOUNTER — Other Ambulatory Visit: Payer: Self-pay | Admitting: Student

## 2023-09-13 DIAGNOSIS — E1165 Type 2 diabetes mellitus with hyperglycemia: Secondary | ICD-10-CM

## 2023-09-13 MED ORDER — METFORMIN HCL ER (OSM) 1000 MG PO TB24: 2000.0000 mg | ORAL_TABLET | Freq: Every day | ORAL | 1 refills | Status: DC

## 2023-09-20 ENCOUNTER — Ambulatory Visit: Payer: MEDICARE | Admitting: Student

## 2023-09-20 ENCOUNTER — Encounter: Payer: Self-pay | Admitting: Student

## 2023-09-20 VITALS — BP 128/76 | HR 72 | Temp 97.5°F | Ht 62.0 in | Wt 122.0 lb

## 2023-09-20 DIAGNOSIS — E1165 Type 2 diabetes mellitus with hyperglycemia: Secondary | ICD-10-CM | POA: Diagnosis not present

## 2023-09-20 DIAGNOSIS — G301 Alzheimer's disease with late onset: Secondary | ICD-10-CM

## 2023-09-20 DIAGNOSIS — Z7189 Other specified counseling: Secondary | ICD-10-CM

## 2023-09-20 DIAGNOSIS — F02A3 Dementia in other diseases classified elsewhere, mild, with mood disturbance: Secondary | ICD-10-CM

## 2023-09-20 DIAGNOSIS — I10 Essential (primary) hypertension: Secondary | ICD-10-CM

## 2023-09-20 DIAGNOSIS — Z91199 Patient's noncompliance with other medical treatment and regimen due to unspecified reason: Secondary | ICD-10-CM

## 2023-09-20 NOTE — Progress Notes (Signed)
 [facility]  Provider: Jann Melody  Code Status: DNR Goals of Care:     09/11/2023    2:23 PM  Advanced Directives  Does Patient Have a Medical Advance Directive? Yes  Type of Advance Directive Out of facility DNR (pink MOST or yellow form)  Does patient want to make changes to medical advance directive? No - Patient declined     Chief Complaint  Patient presents with   Medical Management of Chronic Issues    Medical Management of Chronic Issues. 2 week follow up    HPI: Patient is a 89 y.o. female seen today for a routine visit.  Discussed the use of AI scribe software for clinical note transcription with the patient, who gave verbal consent to proceed.  History of Present Illness   Kristin Deleon is a 89 year old female who presents for a follow-up visit to assess her overall well-being and medication management.  She is tolerating her mood medication well and finds it effective. She has started using a pill pack to organize her medications, particularly her metformin, which has helped normalize her previously high blood sugar levels.  She has experienced a weight loss of about six pounds over the last six months, approximately five percent of her body weight. She denies using protein supplements and insists on consuming adequate protein through her meals. She feels tired all the time and describes everything as an effort, attributing it to her age.  She maintains an active lifestyle by going to the gym three times a week. She received her flu shot this morning without any adverse reactions.  She has been considering moving to assisted living but has decided against it, preferring to prepare her own meals and finding the facilities she visited too small. She maintains regular contact with her family, calling her son every weekend and his wife once or twice a week. Her daughter-in-law occasionally brings meals that last several days. She expresses a  sense of acceptance about her age and life stage, stating a lack of interest in future life events. She misses her sister, with whom she was in regular contact.      Nursing and occupational therapy with concern for non-compliance of medications. Pharmacy, nursing and OT visited home this week for education for pill packs. Patient continues to miss medications. Glucose this morning 271.   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   Diabetes mellitus without complication (HCC)    metformin-oral meds   HOH (hard of hearing)    hearing aids/ bilateral   Hypercholesteremia    Hypertension    moderate/ controlled on meds   Meniere syndrome    PONV (postoperative nausea and vomiting)    difficulty waking up    Past Surgical History:  Procedure Laterality Date   ABDOMINAL HYSTERECTOMY     CATARACT EXTRACTION     CATARACT EXTRACTION W/PHACO Right 09/19/2016   Procedure: CATARACT EXTRACTION PHACO AND INTRAOCULAR LENS PLACEMENT (IOC) Diabetic  Right eye;  Surgeon: Annell Kidney, MD;  Location: [facility];  Service: Ophthalmology;  Laterality: Right;  Diabetic-oral med   CERVICAL FUSION     for fracture C1 and C2   CHOLECYSTECTOMY     ESOPHAGOGASTRODUODENOSCOPY (EGD) WITH PROPOFOL N/A 05/28/2017   Procedure: ESOPHAGOGASTRODUODENOSCOPY (EGD) WITH PROPOFOL;  Surgeon: Toledo, Alphonsus Jeans, MD;  Location: [facility];  Service: Gastroenterology;  Laterality: N/A;   JOINT REPLACEMENT Bilateral  knee and hip   KNEE ARTHROSCOPY     multiple   Morton's neuroma removed from foot     ROTATOR CUFF REPAIR Right    TONSILLECTOMY      Allergies  Allergen Reactions   Amlodipine Swelling    Lower leg edema   Tramadol Nausea Only   Codeine Nausea And Vomiting   Fentanyl Nausea And Vomiting   Hydromorphone Hcl Nausea And Vomiting   Morphine Itching and Nausea And Vomiting    Other reaction(s): Vomiting/ feels like ants under skin   Nucynta [Tapentadol]  Other (See Comments)    hallucinations   Statins     Muscle pain    Tegaderm Ag Mesh [Silver] Other (See Comments)    blisters   Tetracaine Itching   Tetracyclines & Related Rash    Outpatient Encounter Medications as of 09/20/2023  Medication Sig   Acetaminophen 325 MG CAPS Take by mouth.   ARIPiprazole (ABILIFY) 5 MG tablet Take 1 tablet (5 mg total) by mouth at bedtime.   ezetimibe (ZETIA) 10 MG tablet Take 1 tablet (10 mg total) by mouth daily.   ipratropium (ATROVENT) 0.03 % nasal spray Place 2 sprays into both nostrils 2 (two) times daily.   latanoprost (XALATAN) 0.005 % ophthalmic solution Place 1 drop into both eyes at bedtime. 1 drop per eye hour of sleep   meclizine (ANTIVERT) 25 MG tablet Take 1 tablet (25 mg total) by mouth 3 (three) times daily as needed for dizziness.   metformin (FORTAMET) 1000 MG (OSM) 24 hr tablet Take 2 tablets (2,000 mg total) by mouth daily with breakfast.   Multiple Vitamins-Minerals (CENTRUM SILVER 50+WOMEN PO) Take by mouth. am   triamcinolone cream (KENALOG) 0.1 % Apply 1 application topically as needed. Apply externally to the affected area twice daily   vitamin B-12 (CYANOCOBALAMIN) 500 MCG tablet Take 1 tablet (500 mcg total) by mouth daily. dinner   No facility-administered encounter medications on file as of 09/20/2023.    Review of Systems:  Review of Systems  Health Maintenance  Topic Date Due   DEXA SCAN  Never done   DTaP/Tdap/Td (2 - Td or Tdap) 05/12/2020   Medicare Annual Wellness (AWV)  08/22/2023   OPHTHALMOLOGY EXAM  10/18/2023   HEMOGLOBIN A1C  12/29/2023   INFLUENZA VACCINE  01/10/2024   COVID-19 Vaccine (9 - Moderna risk 2024-25 season) 03/21/2024   FOOT EXAM  03/26/2024   Pneumonia Vaccine 81+ Years old  Completed   Zoster Vaccines- Shingrix  Completed   HPV VACCINES  Aged Out   Meningococcal B Vaccine  Aged Out    Physical Exam: Vitals:   09/20/23 1543  BP: 128/76  Pulse: 72  Temp: (!) 97.5 F (36.4 C)   SpO2: 98%  Weight: 122 lb (55.3 kg)  Height: 5\' 2"  (1.575 m)   Body mass index is 22.31 kg/m. Physical Exam Physical Exam   Gen: Well-appearing MSK: Generalized muscle wasting of the arms, legs, shoulders, and face.       Labs reviewed: Basic Metabolic Panel: Recent Labs    12/24/22 0830 01/18/23 1452 07/01/23 0820  NA 140 137 139  K 4.3 3.7 4.5  CL 102 105 100  CO2 30 23 30   GLUCOSE 215* 194* 149*  BUN 20 19 17   CREATININE 1.07* 1.03* 1.03*  CALCIUM 9.7 9.3 9.9  TSH  --   --  2.39   Liver Function Tests: Recent Labs    12/24/22 0830 01/18/23 1452 07/01/23 0820  AST 14 18 16   ALT 8 12 9   ALKPHOS  --  61  --   BILITOT 0.5 0.9 0.6  PROT 6.4 6.9 7.2  ALBUMIN  --  3.8  --    No results for input(s): "LIPASE", "AMYLASE" in the last 8760 hours. No results for input(s): "AMMONIA" in the last 8760 hours. CBC: Recent Labs    12/24/22 0830 01/18/23 1452 07/01/23 0820  WBC 7.9 8.3 7.1  NEUTROABS 3,974 4.6  --   HGB 12.1 12.7 13.7  HCT 37.6 38.6 43.1  MCV 92.6 91.5 92.3  PLT 213 242 230   Lipid Panel: Recent Labs    07/01/23 0820  CHOL 200*  HDL 55  LDLCALC 120*  TRIG 136  CHOLHDL 3.6   Lab Results  Component Value Date   HGBA1C 8.3 (H) 07/01/2023    Procedures since last visit: No results found. Results           Assessment/Plan     Mood disorder She is on medication for mood stabilization and reports adherence. She expresses apathy and lack of interest in activities, suggesting possible underlying depression, though she attributes this to aging and natural decline. Her indifference to her situation may affect her quality of life. - Continue current mood medication regimen  Dementia Unintentional weight loss She has lost approximately 6 pounds over the last six months, equating to about 5% of her body weight. There is concern about insufficient protein intake despite her assurance of adequate consumption. She has not used protein  supplements and is unconcerned about the weight loss. Persistent weight loss may necessitate a change in living situation to ensure proper nutrition. - Monitor weight and nutritional intake - Consider protein supplements if weight loss continues - Evaluate living situation if weight loss persists  Diabetes mellitus She has diabetes with previous concerns about missed metformin doses due to hyperglycemia. Her blood sugar levels have normalized, possibly due to dietary changes. She now uses a pill pack to organize medications, aiding in adherence. - Ensure adherence to metformin regimen using pill pack - Monitor blood glucose levels regularly  General Health Maintenance She received a flu vaccination recently without adverse reactions. She maintains an active lifestyle, attending the gym thrice weekly, and prepares balanced meals including protein, vegetables, and starches. - Encourage continued physical activity and balanced diet - Ensure adherence to vaccination schedule  Goals of Care She prefers to remain independent and avoid assisted living, valuing meal preparation and her current living situation. She has a supportive network with family and an aide assisting twice weekly. She acknowledges aging decline and is uninterested in life-prolonging measures that compromise her lifestyle. - Respect her preference to remain independent - Continue current support system with family and aide  Follow-up A follow-up appointment is scheduled in one month with plans to closely monitor her weight and nutritional status. - Schedule follow-up appointment in one month - Arrange for phlebotomist to obtain labs before next appointment      Non-compliance Patient would benefit from additional support for medication adherence.   I spent greater than 30 minutes for the care of this patient in face to face time, chart review, clinical documentation, patient education.    Labs/tests ordered:  * No order  type specified * Next appt:  10/09/2023

## 2023-09-20 NOTE — Patient Instructions (Signed)
 VISIT SUMMARY:  During your follow-up visit, we discussed your overall well-being and medication management. You are tolerating your mood medication well and have found it effective. Your blood sugar levels have normalized with the help of a pill pack to organize your medications. You have experienced some weight loss over the past six months and feel tired often, which you attribute to aging. You maintain an active lifestyle and received your flu shot today without any issues. You have decided against moving to assisted living and prefer to stay independent, preparing your own meals and maintaining regular contact with your family.  YOUR PLAN:  -MOOD DISORDER: You are currently on medication to help stabilize your mood and are taking it as prescribed. You have expressed feelings of apathy and lack of interest in activities, which may be related to depression. We will continue with your current medication regimen.  -UNINTENTIONAL WEIGHT LOSS: You have lost about 6 pounds over the last six months, which is about 5% of your body weight. Although you believe you are consuming enough protein, we need to monitor your weight and nutritional intake closely. If the weight loss continues, we may need to consider protein supplements and evaluate your living situation to ensure you are getting proper nutrition.  -DIABETES MELLITUS: You have diabetes and have been using a pill pack to help you take your metformin regularly, which has helped normalize your blood sugar levels. It is important to continue using the pill pack and monitor your blood glucose levels regularly.  -GENERAL HEALTH MAINTENANCE: You received your flu vaccination today without any issues. You are staying active by going to the gym three times a week and preparing balanced meals. Continue with your physical activity and balanced diet, and make sure to keep up with your vaccination schedule.  -GOALS OF CARE: You prefer to remain independent and  avoid moving to assisted living. You value preparing your own meals and have a supportive network with your family and an aide who assists you twice a week. We will respect your preference to stay independent and continue with your current support system.  INSTRUCTIONS:  Please schedule a follow-up appointment in one month. We will closely monitor your weight and nutritional status. Additionally, we will arrange for a phlebotomist to obtain labs before your next appointment.

## 2023-09-21 ENCOUNTER — Encounter: Payer: Self-pay | Admitting: Student

## 2023-10-01 LAB — CBC WITH DIFFERENTIAL/PLATELET
Absolute Lymphocytes: 2930 {cells}/uL (ref 850–3900)
Absolute Monocytes: 475 {cells}/uL (ref 200–950)
Basophils Absolute: 40 {cells}/uL (ref 0–200)
Basophils Relative: 0.6 %
Eosinophils Absolute: 112 {cells}/uL (ref 15–500)
Eosinophils Relative: 1.7 %
HCT: 37.2 % (ref 35.0–45.0)
Hemoglobin: 12 g/dL (ref 11.7–15.5)
MCH: 29.3 pg (ref 27.0–33.0)
MCHC: 32.3 g/dL (ref 32.0–36.0)
MCV: 91 fL (ref 80.0–100.0)
MPV: 10.3 fL (ref 7.5–12.5)
Monocytes Relative: 7.2 %
Neutro Abs: 3043 {cells}/uL (ref 1500–7800)
Neutrophils Relative %: 46.1 %
Platelets: 229 10*3/uL (ref 140–400)
RBC: 4.09 10*6/uL (ref 3.80–5.10)
RDW: 13.2 % (ref 11.0–15.0)
Total Lymphocyte: 44.4 %
WBC: 6.6 10*3/uL (ref 3.8–10.8)

## 2023-10-01 LAB — COMPREHENSIVE METABOLIC PANEL WITH GFR
AG Ratio: 1.9 (calc) (ref 1.0–2.5)
ALT: 9 U/L (ref 6–29)
AST: 14 U/L (ref 10–35)
Albumin: 4 g/dL (ref 3.6–5.1)
Alkaline phosphatase (APISO): 56 U/L (ref 37–153)
BUN: 15 mg/dL (ref 7–25)
CO2: 27 mmol/L (ref 20–32)
Calcium: 9.1 mg/dL (ref 8.6–10.4)
Chloride: 102 mmol/L (ref 98–110)
Creat: 0.94 mg/dL (ref 0.60–0.95)
Globulin: 2.1 g/dL (ref 1.9–3.7)
Glucose, Bld: 217 mg/dL — ABNORMAL HIGH (ref 65–99)
Potassium: 3.9 mmol/L (ref 3.5–5.3)
Sodium: 135 mmol/L (ref 135–146)
Total Bilirubin: 0.5 mg/dL (ref 0.2–1.2)
Total Protein: 6.1 g/dL (ref 6.1–8.1)
eGFR: 57 mL/min/{1.73_m2} — ABNORMAL LOW (ref >=60)

## 2023-10-01 LAB — HEMOGLOBIN A1C
Hgb A1c MFr Bld: 8.6 % — ABNORMAL HIGH (ref <5.7)
Mean Plasma Glucose: 200 mg/dL
eAG (mmol/L): 11.1 mmol/L

## 2023-10-02 ENCOUNTER — Ambulatory Visit: Payer: MEDICARE | Admitting: Student

## 2023-10-05 ENCOUNTER — Encounter: Payer: Self-pay | Admitting: Student

## 2023-10-07 ENCOUNTER — Other Ambulatory Visit: Payer: Self-pay | Admitting: Student

## 2023-10-08 LAB — CBC WITH DIFFERENTIAL/PLATELET
Absolute Lymphocytes: 2951 {cells}/uL (ref 850–3900)
Absolute Monocytes: 490 {cells}/uL (ref 200–950)
Basophils Absolute: 48 {cells}/uL (ref 0–200)
Basophils Relative: 0.7 %
Eosinophils Absolute: 109 {cells}/uL (ref 15–500)
Eosinophils Relative: 1.6 %
HCT: 41 % (ref 35.0–45.0)
Hemoglobin: 12.9 g/dL (ref 11.7–15.5)
MCH: 28.7 pg (ref 27.0–33.0)
MCHC: 31.5 g/dL — ABNORMAL LOW (ref 32.0–36.0)
MCV: 91.1 fL (ref 80.0–100.0)
MPV: 10.9 fL (ref 7.5–12.5)
Monocytes Relative: 7.2 %
Neutro Abs: 3203 {cells}/uL (ref 1500–7800)
Neutrophils Relative %: 47.1 %
Platelets: 252 10*3/uL (ref 140–400)
RBC: 4.5 10*6/uL (ref 3.80–5.10)
RDW: 13.1 % (ref 11.0–15.0)
Total Lymphocyte: 43.4 %
WBC: 6.8 10*3/uL (ref 3.8–10.8)

## 2023-10-08 LAB — COMPLETE METABOLIC PANEL WITHOUT GFR
AG Ratio: 1.7 (calc) (ref 1.0–2.5)
ALT: 11 U/L (ref 6–29)
AST: 17 U/L (ref 10–35)
Albumin: 4.3 g/dL (ref 3.6–5.1)
Alkaline phosphatase (APISO): 65 U/L (ref 37–153)
BUN: 17 mg/dL (ref 7–25)
CO2: 28 mmol/L (ref 20–32)
Calcium: 9.4 mg/dL (ref 8.6–10.4)
Chloride: 102 mmol/L (ref 98–110)
Creat: 0.92 mg/dL (ref 0.60–0.95)
Globulin: 2.6 g/dL (ref 1.9–3.7)
Glucose, Bld: 140 mg/dL — ABNORMAL HIGH (ref 65–99)
Potassium: 4 mmol/L (ref 3.5–5.3)
Sodium: 139 mmol/L (ref 135–146)
Total Bilirubin: 0.5 mg/dL (ref 0.2–1.2)
Total Protein: 6.9 g/dL (ref 6.1–8.1)

## 2023-10-08 LAB — HEMOGLOBIN A1C
Hgb A1c MFr Bld: 8.5 % — ABNORMAL HIGH (ref <5.7)
Mean Plasma Glucose: 197 mg/dL
eAG (mmol/L): 10.9 mmol/L

## 2023-10-09 ENCOUNTER — Encounter: Payer: Self-pay | Admitting: Student

## 2023-10-09 ENCOUNTER — Ambulatory Visit (INDEPENDENT_AMBULATORY_CARE_PROVIDER_SITE_OTHER): Payer: MEDICARE | Admitting: Student

## 2023-10-09 VITALS — BP 138/82 | HR 82 | Temp 98.3°F | Ht 62.0 in | Wt 128.0 lb

## 2023-10-09 DIAGNOSIS — Z7189 Other specified counseling: Secondary | ICD-10-CM

## 2023-10-09 DIAGNOSIS — E1165 Type 2 diabetes mellitus with hyperglycemia: Secondary | ICD-10-CM

## 2023-10-09 DIAGNOSIS — M7989 Other specified soft tissue disorders: Secondary | ICD-10-CM

## 2023-10-09 NOTE — Progress Notes (Signed)
 [facility] Provider: Jann Melody  Code Status: DNR Goals of Care:     09/11/2023    2:23 PM  Advanced Directives  Does Patient Have a Medical Advance Directive? Yes  Type of Advance Directive Out of facility DNR (pink MOST or yellow form)  Does patient want to make changes to medical advance directive? No - Patient declined     Chief Complaint  Patient presents with   Medical Management of Chronic Issues    Medical Management of Chronic Issues. 2 week Follow up    HPI: Patient is a 89 y.o. female seen today for medical management of chronic diseases.   Discussed the use of AI scribe software for clinical note transcription with the patient, who gave verbal consent to proceed.  History of Present Illness   Kristin Deleon is a 89 year old female with diabetes who presents for follow-up of her blood sugar levels.  Recent lab results show an A1c of 8.5%, indicating poor glycemic control with blood sugar levels consistently ranging between 150 and 200. She seeks clarification on these results and their implications.  She has made dietary changes, eliminating cookies and ceasing baking activities. Her breakfast alternates between one egg and dry cereal, while her main meal at noon includes protein, vegetables, and either pasta or potatoes. Her evening meal is similar, and she has stopped consuming cookies, cakes, and ice cream.  She has been sleeping reasonably well and does not feel different since starting a new medication in her pill pack. She has gained a few pounds since her last visit, which she attributes to increased sedentary behavior. She experiences some swelling in her legs, which she believes is due to sitting, but she attends the gym three times a week.  She lives alone and does not enjoy eating alone, although she does not seek companionship. Her daughter, who lives nearby, visits infrequently and occasionally leaves food for her. Her son lives  far away and visits are also infrequent.         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   Diabetes mellitus without complication (HCC)    metformin -oral meds   HOH (hard of hearing)    hearing aids/ bilateral   Hypercholesteremia    Hypertension    moderate/ controlled on meds   Meniere syndrome    PONV (postoperative nausea and vomiting)    difficulty waking up    Past Surgical History:  Procedure Laterality Date   ABDOMINAL HYSTERECTOMY     CATARACT EXTRACTION     CATARACT EXTRACTION W/PHACO Right 09/19/2016   Procedure: CATARACT EXTRACTION PHACO AND INTRAOCULAR LENS PLACEMENT (IOC) Diabetic  Right eye;  Surgeon: Annell Kidney, MD;  Location: [facility];  Service: Ophthalmology;  Laterality: Right;  Diabetic-oral med   CERVICAL FUSION     for fracture C1 and C2   CHOLECYSTECTOMY     ESOPHAGOGASTRODUODENOSCOPY (EGD) WITH PROPOFOL  N/A 05/28/2017   Procedure: ESOPHAGOGASTRODUODENOSCOPY (EGD) WITH PROPOFOL ;  Surgeon: Toledo, Alphonsus Jeans, MD;  Location: [facility];  Service: Gastroenterology;  Laterality: N/A;   JOINT REPLACEMENT Bilateral    knee and hip   KNEE ARTHROSCOPY     multiple   Morton's neuroma removed from foot     ROTATOR CUFF REPAIR Right    TONSILLECTOMY      Allergies  Allergen Reactions   Amlodipine Swelling    Lower leg edema  Tramadol Nausea Only   Codeine Nausea And Vomiting   Fentanyl  Nausea And Vomiting   Hydromorphone Hcl Nausea And Vomiting   Morphine Itching and Nausea And Vomiting    Other reaction(s): Vomiting/ feels like ants under skin   Nucynta [Tapentadol] Other (See Comments)    hallucinations   Statins     Muscle pain    Tegaderm Ag Mesh [Silver] Other (See Comments)    blisters   Tetracaine Itching   Tetracyclines & Related Rash    Outpatient Encounter Medications as of 10/09/2023  Medication Sig   Acetaminophen 325 MG CAPS Take by mouth.   ARIPiprazole  (ABILIFY ) 5 MG tablet  Take 1 tablet (5 mg total) by mouth at bedtime.   ezetimibe  (ZETIA ) 10 MG tablet Take 1 tablet (10 mg total) by mouth daily.   ipratropium (ATROVENT) 0.03 % nasal spray Place 2 sprays into both nostrils 2 (two) times daily.   latanoprost  (XALATAN ) 0.005 % ophthalmic solution Place 1 drop into both eyes at bedtime. 1 drop per eye hour of sleep   meclizine  (ANTIVERT ) 25 MG tablet Take 1 tablet (25 mg total) by mouth 3 (three) times daily as needed for dizziness.   metformin  (FORTAMET ) 1000 MG (OSM) 24 hr tablet Take 2 tablets (2,000 mg total) by mouth daily with breakfast.   Multiple Vitamins-Minerals (CENTRUM SILVER 50+WOMEN PO) Take by mouth. am   triamcinolone cream (KENALOG) 0.1 % Apply 1 application topically as needed. Apply externally to the affected area twice daily   vitamin B-12 (CYANOCOBALAMIN) 500 MCG tablet Take 1 tablet (500 mcg total) by mouth daily. dinner   No facility-administered encounter medications on file as of 10/09/2023.    Review of Systems:  Review of Systems  Health Maintenance  Topic Date Due   DEXA SCAN  Never done   DTaP/Tdap/Td (2 - Td or Tdap) 05/12/2020   Medicare Annual Wellness (AWV)  08/22/2023   OPHTHALMOLOGY EXAM  10/18/2023   INFLUENZA VACCINE  01/10/2024   COVID-19 Vaccine (9 - Moderna risk 2024-25 season) 03/21/2024   FOOT EXAM  03/26/2024   HEMOGLOBIN A1C  04/07/2024   Pneumonia Vaccine 58+ Years old  Completed   Zoster Vaccines- Shingrix  Completed   HPV VACCINES  Aged Out   Meningococcal B Vaccine  Aged Out    Physical Exam: Vitals:   10/09/23 1537  BP: 138/82  Pulse: 82  Temp: 98.3 F (36.8 C)  SpO2: 97%  Weight: 128 lb (58.1 kg)  Height: 5\' 2"  (1.575 m)   Body mass index is 23.41 kg/m. Physical Exam Constitutional:      Appearance: Normal appearance.  Cardiovascular:     Rate and Rhythm: Normal rate and regular rhythm.     Pulses: Normal pulses.     Heart sounds: Normal heart sounds.  Pulmonary:     Effort: Pulmonary  effort is normal.  Abdominal:     General: Abdomen is flat. Bowel sounds are normal.     Palpations: Abdomen is soft.  Musculoskeletal:        General: No swelling or tenderness.  Skin:    General: Skin is warm and dry.  Neurological:     Mental Status: She is alert.     Gait: Gait normal.  Psychiatric:        Mood and Affect: Mood normal.    Physical Exam   EXTREMITIES: Bilateral leg swelling.      Labs reviewed: Basic Metabolic Panel: Recent Labs    07/01/23 0820 09/30/23  0815 10/07/23 0811  NA 139 135 139  K 4.5 3.9 4.0  CL 100 102 102  CO2 30 27 28   GLUCOSE 149* 217* 140*  BUN 17 15 17   CREATININE 1.03* 0.94 0.92  CALCIUM 9.9 9.1 9.4  TSH 2.39  --   --    Liver Function Tests: Recent Labs    01/18/23 1452 07/01/23 0820 09/30/23 0815 10/07/23 0811  AST 18 16 14 17   ALT 12 9 9 11   ALKPHOS 61  --   --   --   BILITOT 0.9 0.6 0.5 0.5  PROT 6.9 7.2 6.1 6.9  ALBUMIN 3.8  --   --   --    No results for input(s): "LIPASE", "AMYLASE" in the last 8760 hours. No results for input(s): "AMMONIA" in the last 8760 hours. CBC: Recent Labs    01/18/23 1452 07/01/23 0820 09/30/23 0815 10/07/23 0811  WBC 8.3 7.1 6.6 6.8  NEUTROABS 4.6  --  3,043 3,203  HGB 12.7 13.7 12.0 12.9  HCT 38.6 43.1 37.2 41.0  MCV 91.5 92.3 91.0 91.1  PLT 242 230 229 252   Lipid Panel: Recent Labs    07/01/23 0820  CHOL 200*  HDL 55  LDLCALC 120*  TRIG 136  CHOLHDL 3.6   Lab Results  Component Value Date   HGBA1C 8.5 (H) 10/07/2023    Procedures since last visit: No results found. Results   LABS Glucose: 140 mg/dL EAV4U: 9.8%      Assessment/Plan     Type 2 Diabetes Mellitus with Hyperglycemia Chronic type 2 diabetes mellitus with hyperglycemia. A1c is 8.5, indicating suboptimal glycemic control. The goal is to achieve an A1c of less than 8. She reports dietary changes, including reduced baking and consumption of sweets, which may contribute to future improvements.  She is hesitant to start new medications due to potential side effects and prefers dietary management. - Encourage continued dietary modifications to reduce blood glucose levels. - Discuss potential medication options if dietary changes do not achieve desired A1c levels.  Edema of both legs Bilateral leg edema likely due to prolonged sitting. She attends the gym three times a week, which benefits circulation. No significant concerns about the edema at this time. - Advise elevating legs when sitting to reduce edema.  Goals of Care She has expressed clear goals of care, including not wanting heroic measures at the end of life. She has discussed her wishes with her children and has made funeral arrangements. She prefers not to be in a hospital setting at the end of life and desires a less invasive approach. - Ensure documentation of her goals of care and advance directives in her medical record.  Follow-up Regular follow-up is important to monitor diabetes management and overall health. - Schedule follow-up appointment to monitor diabetes and overall health.      Labs/tests ordered:  * No order type specified * Next appt:  12/04/2023

## 2023-10-09 NOTE — Patient Instructions (Signed)
 VISIT SUMMARY:  During your visit, we reviewed your recent lab results and discussed your blood sugar levels, dietary changes, and overall health. We also talked about the swelling in your legs and your goals of care.  YOUR PLAN:  -TYPE 2 DIABETES MELLITUS WITH HYPERGLYCEMIA: Type 2 diabetes is a condition where your body does not use insulin properly, leading to high blood sugar levels. Your A1c is 8.5%, which means your blood sugar levels have been higher than desired. You have made some positive dietary changes, and we encourage you to continue these efforts. If your blood sugar levels do not improve, we may need to discuss medication options.  -EDEMA OF BOTH LEGS: Edema is swelling caused by fluid buildup, often due to prolonged sitting. To help reduce the swelling, try to elevate your legs when you are sitting.  -GOALS OF CARE: You have expressed your wishes for end-of-life care, preferring not to have heroic measures and to avoid hospital settings. These preferences have been documented in your medical record.  INSTRUCTIONS:  Please schedule a follow-up appointment to monitor your diabetes and overall health. Continue with your dietary changes and try to elevate your legs when sitting to reduce swelling.

## 2023-10-25 ENCOUNTER — Encounter: Payer: MEDICARE | Admitting: Student

## 2023-11-28 ENCOUNTER — Other Ambulatory Visit: Payer: Self-pay

## 2023-11-28 MED ORDER — GLUCOSE BLOOD VI STRP: ORAL_STRIP | 3 refills | Status: DC

## 2023-12-04 ENCOUNTER — Encounter: Payer: MEDICARE | Admitting: Student

## 2023-12-10 ENCOUNTER — Other Ambulatory Visit: Payer: Self-pay | Admitting: *Deleted

## 2023-12-10 MED ORDER — GLUCOSE BLOOD VI STRP: ORAL_STRIP | 3 refills | Status: AC

## 2023-12-10 NOTE — Telephone Encounter (Signed)
 Glynn, Clinic nurse, asked that I resend Rx to pharmacy due to them not receiving the first one. Stated that she called them yesterday and they never received it.

## 2023-12-20 ENCOUNTER — Encounter: Payer: MEDICARE | Admitting: Student

## 2023-12-21 NOTE — Progress Notes (Signed)
   This encounter was created in error - please disregard. No show

## 2023-12-25 ENCOUNTER — Ambulatory Visit: Payer: MEDICARE | Admitting: Student

## 2023-12-25 DIAGNOSIS — Z91199 Patient's noncompliance with other medical treatment and regimen due to unspecified reason: Secondary | ICD-10-CM | POA: Insufficient documentation

## 2023-12-28 NOTE — Progress Notes (Signed)
 A user error has taken place: no show

## 2024-01-03 ENCOUNTER — Ambulatory Visit: Payer: MEDICARE | Admitting: Student

## 2024-01-03 ENCOUNTER — Encounter: Payer: Self-pay | Admitting: Student

## 2024-01-03 VITALS — BP 132/76 | HR 81 | Temp 97.2°F | Ht 62.0 in | Wt 127.0 lb

## 2024-01-03 DIAGNOSIS — E1165 Type 2 diabetes mellitus with hyperglycemia: Secondary | ICD-10-CM | POA: Diagnosis not present

## 2024-01-03 NOTE — Progress Notes (Signed)
 [facility] Provider: ABDUL  Code Status: DNR Goals of Care:     09/11/2023    2:23 PM  Advanced Directives  Does Patient Have a Medical Advance Directive? Yes  Type of Advance Directive Out of facility DNR (pink MOST or yellow form)  Does patient want to make changes to medical advance directive? No - Patient declined     Chief Complaint  Patient presents with   Medical Management of Chronic Issues    Medical Management of Chronic Issues. 2 Month follow up    HPI: Patient is a 89 y.o. female seen today for medical management of chronic diseases.   Discussed the use of AI scribe software for clinical note transcription with the patient, who gave verbal consent to proceed.  History of Present Illness   Kristin Deleon is a 89 year old female who presents for routine follow-up and blood work.  She maintains an active lifestyle, attending the gym three times a week. She lives independently and receives assistance from an aide named Patsy twice a week for grocery shopping and household tasks. She expressed dissatisfaction with a substitute aide's performance when Patsy was unavailable.  Her sleep pattern is consistent, with a bedtime around 11 PM and waking between 6:30 and 7 AM without an alarm. She describes her mood as fine and considers herself 'healthy as can be'.  She receives her medications in pill packs from [facility], which she finds satisfactory. She recalls her A1c was higher at her last appointment but is not concerned about her weight.  She wears a neck brace when in a car to prevent injury from sudden stops. No recent falls and she reports eating well.         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   Diabetes mellitus without complication (HCC)    metformin -oral meds   HOH (hard of hearing)    hearing aids/ bilateral   Hypercholesteremia    Hypertension    moderate/ controlled on meds    Meniere syndrome    PONV (postoperative nausea and vomiting)    difficulty waking up    Past Surgical History:  Procedure Laterality Date   ABDOMINAL HYSTERECTOMY     CATARACT EXTRACTION     CATARACT EXTRACTION W/PHACO Right 09/19/2016   Procedure: CATARACT EXTRACTION PHACO AND INTRAOCULAR LENS PLACEMENT (IOC) Diabetic  Right eye;  Surgeon: Dene Etienne, MD;  Location: [facility];  Service: Ophthalmology;  Laterality: Right;  Diabetic-oral med   CERVICAL FUSION     for fracture C1 and C2   CHOLECYSTECTOMY     ESOPHAGOGASTRODUODENOSCOPY (EGD) WITH PROPOFOL  N/A 05/28/2017   Procedure: ESOPHAGOGASTRODUODENOSCOPY (EGD) WITH PROPOFOL ;  Surgeon: Toledo, Ladell POUR, MD;  Location: [facility];  Service: Gastroenterology;  Laterality: N/A;   JOINT REPLACEMENT Bilateral    knee and hip   KNEE ARTHROSCOPY     multiple   Morton's neuroma removed from foot     ROTATOR CUFF REPAIR Right    TONSILLECTOMY      Allergies  Allergen Reactions   Amlodipine Swelling    Lower leg edema   Tramadol Nausea Only   Codeine Nausea And Vomiting   Fentanyl  Nausea And Vomiting   Hydromorphone Hcl Nausea And Vomiting   Morphine Itching and Nausea And Vomiting    Other reaction(s): Vomiting/ feels like ants under skin   Nucynta [Tapentadol] Other (See Comments)  hallucinations   Statins     Muscle pain    Tegaderm Ag Mesh [Silver] Other (See Comments)    blisters   Tetracaine Itching   Tetracyclines & Related Rash    Outpatient Encounter Medications as of 01/03/2024  Medication Sig   Acetaminophen 325 MG CAPS Take by mouth.   ARIPiprazole  (ABILIFY ) 5 MG tablet Take 1 tablet (5 mg total) by mouth at bedtime.   ezetimibe  (ZETIA ) 10 MG tablet Take 1 tablet (10 mg total) by mouth daily.   glucose blood test strip Use to test blood sugar twice daily. Dx:E11.65   ipratropium (ATROVENT) 0.03 % nasal spray Place 2 sprays into both nostrils 2 (two) times daily.   latanoprost  (XALATAN )  0.005 % ophthalmic solution Place 1 drop into both eyes at bedtime. 1 drop per eye hour of sleep   meclizine  (ANTIVERT ) 25 MG tablet Take 1 tablet (25 mg total) by mouth 3 (three) times daily as needed for dizziness.   metformin  (FORTAMET ) 1000 MG (OSM) 24 hr tablet Take 2 tablets (2,000 mg total) by mouth daily with breakfast.   Multiple Vitamins-Minerals (CENTRUM SILVER 50+WOMEN PO) Take by mouth. am   triamcinolone cream (KENALOG) 0.1 % Apply 1 application topically as needed. Apply externally to the affected area twice daily   vitamin B-12 (CYANOCOBALAMIN) 500 MCG tablet Take 1 tablet (500 mcg total) by mouth daily. dinner   No facility-administered encounter medications on file as of 01/03/2024.    Review of Systems:  Review of Systems  Health Maintenance  Topic Date Due   DEXA SCAN  Never done   DTaP/Tdap/Td (2 - Td or Tdap) 05/12/2020   Medicare Annual Wellness (AWV)  08/22/2023   OPHTHALMOLOGY EXAM  10/18/2023   INFLUENZA VACCINE  01/10/2024   COVID-19 Vaccine (9 - Moderna risk 2024-25 season) 03/21/2024   FOOT EXAM  03/26/2024   HEMOGLOBIN A1C  04/07/2024   Pneumococcal Vaccine: 50+ Years  Completed   Zoster Vaccines- Shingrix  Completed   Hepatitis B Vaccines  Aged Out   HPV VACCINES  Aged Out   Meningococcal B Vaccine  Aged Out    Physical Exam: Vitals:   01/03/24 1121  BP: 132/76  Pulse: 81  Temp: (!) 97.2 F (36.2 C)  SpO2: 95%  Weight: 127 lb (57.6 kg)  Height: 5' 2 (1.575 m)   Body mass index is 23.23 kg/m. Physical Exam Constitutional:      Appearance: Normal appearance.  Cardiovascular:     Rate and Rhythm: Normal rate and regular rhythm.     Pulses: Normal pulses.     Heart sounds: Normal heart sounds.  Pulmonary:     Effort: Pulmonary effort is normal.  Abdominal:     General: Abdomen is flat. Bowel sounds are normal.     Palpations: Abdomen is soft.  Musculoskeletal:        General: No swelling or tenderness.  Skin:    General: Skin is  warm and dry.  Neurological:     Mental Status: She is alert. She is disoriented.     Gait: Gait normal.  Psychiatric:        Mood and Affect: Mood normal.    Physical Exam          Labs reviewed: Basic Metabolic Panel: Recent Labs    07/01/23 0820 09/30/23 0815 10/07/23 0811  NA 139 135 139  K 4.5 3.9 4.0  CL 100 102 102  CO2 30 27 28   GLUCOSE 149* 217* 140*  BUN 17 15 17   CREATININE 1.03* 0.94 0.92  CALCIUM 9.9 9.1 9.4  TSH 2.39  --   --    Liver Function Tests: Recent Labs    01/18/23 1452 07/01/23 0820 09/30/23 0815 10/07/23 0811  AST 18 16 14 17   ALT 12 9 9 11   ALKPHOS 61  --   --   --   BILITOT 0.9 0.6 0.5 0.5  PROT 6.9 7.2 6.1 6.9  ALBUMIN 3.8  --   --   --    No results for input(s): LIPASE, AMYLASE in the last 8760 hours. No results for input(s): AMMONIA in the last 8760 hours. CBC: Recent Labs    01/18/23 1452 07/01/23 0820 09/30/23 0815 10/07/23 0811  WBC 8.3 7.1 6.6 6.8  NEUTROABS 4.6  --  3,043 3,203  HGB 12.7 13.7 12.0 12.9  HCT 38.6 43.1 37.2 41.0  MCV 91.5 92.3 91.0 91.1  PLT 242 230 229 252   Lipid Panel: Recent Labs    07/01/23 0820  CHOL 200*  HDL 55  LDLCALC 120*  TRIG 136  CHOLHDL 3.6   Lab Results  Component Value Date   HGBA1C 8.5 (H) 10/07/2023    Procedures since last visit: No results found. Results   A1c: elevated (10/03/2021)      Assessment/Plan    Diabetes Hemoglobin A1c is elevated, indicating potential worsening of glycemic control. She receives pill packs from [facility], suggesting ongoing management of diabetes or prediabetes. - Arrange for home blood work to reassess Hemoglobin A1c levels.  Cognitive Impairment Exhibits mild cognitive impairment with difficulty in orientation to time, specifically the year, while correctly identifying her name, birthdate, and location. She forgets short-term interactions such as frequency of family visits, and repeats parts of conversations.   General  Health Maintenance Engages in an active lifestyle by attending the gym three times a week and maintains a balanced diet. Receives assistance from an aide for grocery shopping and other tasks. Reports a well-managed mood and adequate sleep, with a desire to see her children more often.  Follow-up Ongoing monitoring of health status, particularly glycemic control, is necessary. - Schedule follow-up appointment in three months.          Labs/tests ordered:  * No order type specified * Next appt:  04/03/2024

## 2024-01-03 NOTE — Patient Instructions (Signed)
 VISIT SUMMARY:  During your visit today, we discussed your overall health and well-being. You have been maintaining an active lifestyle and have not experienced any falls. We reviewed your medications, sleep patterns, and social support system. We also addressed some specific health concerns, including your A1c levels and cognitive function.  YOUR PLAN:  -ELEVATED A1C: Your A1c levels, which measure your average blood sugar over the past 2-3 months, are higher than before. We will order blood work to check your A1c levels at your home to better understand your glycemic control.  -COGNITIVE DECLINE: You had some difficulty recalling the current year, which may indicate a decline in cognitive function. We will continue to monitor this closely.  -USE OF NECK BRACE: You use a neck brace in the car to prevent injury from sudden stops. While you prefer not to wear it, it is important for your safety.  -SOCIAL SUPPORT AND ACTIVITIES: You are staying active by going to the gym three times a week and receiving help from your aide, Patsy, twice a week. Although family visits are limited, it is good that you have some support.  INSTRUCTIONS:  Please complete the blood work at your home as ordered to check your A1c levels. Continue to stay active and use your neck brace in the car for safety. We will monitor your cognitive function during future visits.

## 2024-01-06 ENCOUNTER — Ambulatory Visit: Payer: Self-pay | Admitting: Student

## 2024-01-07 LAB — CBC WITH DIFFERENTIAL/PLATELET
Absolute Lymphocytes: 2865 {cells}/uL (ref 850–3900)
Absolute Monocytes: 532 {cells}/uL (ref 200–950)
Basophils Absolute: 38 {cells}/uL (ref 0–200)
Basophils Relative: 0.5 %
Eosinophils Absolute: 122 {cells}/uL (ref 15–500)
Eosinophils Relative: 1.6 %
HCT: 40.9 % (ref 35.0–45.0)
Hemoglobin: 13 g/dL (ref 11.7–15.5)
MCH: 29.3 pg (ref 27.0–33.0)
MCHC: 31.8 g/dL — ABNORMAL LOW (ref 32.0–36.0)
MCV: 92.3 fL (ref 80.0–100.0)
MPV: 11 fL (ref 7.5–12.5)
Monocytes Relative: 7 %
Neutro Abs: 4043 {cells}/uL (ref 1500–7800)
Neutrophils Relative %: 53.2 %
Platelets: 206 Thousand/uL (ref 140–400)
RBC: 4.43 Million/uL (ref 3.80–5.10)
RDW: 12.3 % (ref 11.0–15.0)
Total Lymphocyte: 37.7 %
WBC: 7.6 Thousand/uL (ref 3.8–10.8)

## 2024-01-07 LAB — LIPID PANEL
Cholesterol: 164 mg/dL (ref <200)
HDL: 51 mg/dL (ref >=50)
LDL Cholesterol (Calc): 95 mg/dL
Non-HDL Cholesterol (Calc): 113 mg/dL (ref <130)
Total CHOL/HDL Ratio: 3.2 (calc) (ref <5.0)
Triglycerides: 88 mg/dL (ref <150)

## 2024-01-07 LAB — COMPLETE METABOLIC PANEL WITHOUT GFR
AG Ratio: 1.8 (calc) (ref 1.0–2.5)
ALT: 10 U/L (ref 6–29)
AST: 15 U/L (ref 10–35)
Albumin: 4.3 g/dL (ref 3.6–5.1)
Alkaline phosphatase (APISO): 56 U/L (ref 37–153)
BUN/Creatinine Ratio: 19 (calc) (ref 6–22)
BUN: 20 mg/dL (ref 7–25)
CO2: 29 mmol/L (ref 20–32)
Calcium: 9.8 mg/dL (ref 8.6–10.4)
Chloride: 102 mmol/L (ref 98–110)
Creat: 1.05 mg/dL — ABNORMAL HIGH (ref 0.60–0.95)
Globulin: 2.4 g/dL (ref 1.9–3.7)
Glucose, Bld: 171 mg/dL — ABNORMAL HIGH (ref 65–139)
Potassium: 3.9 mmol/L (ref 3.5–5.3)
Sodium: 140 mmol/L (ref 135–146)
Total Bilirubin: 0.5 mg/dL (ref 0.2–1.2)
Total Protein: 6.7 g/dL (ref 6.1–8.1)

## 2024-01-07 LAB — MICROALBUMIN / CREATININE URINE RATIO
Creatinine, Urine: 85 mg/dL (ref 20–275)
Microalb Creat Ratio: 14 mg/g{creat} (ref <30)
Microalb, Ur: 1.2 mg/dL

## 2024-01-07 LAB — HEMOGLOBIN A1C
Hgb A1c MFr Bld: 7.1 % — ABNORMAL HIGH (ref <5.7)
Mean Plasma Glucose: 157 mg/dL
eAG (mmol/L): 8.7 mmol/L

## 2024-01-07 LAB — TSH: TSH: 1.74 m[IU]/L (ref 0.40–4.50)

## 2024-02-18 ENCOUNTER — Observation Stay
Admission: EM | Admit: 2024-02-18 | Discharge: 2024-02-20 | Disposition: A | Discharge status: Skilled Nursing Facility | Payer: MEDICARE | Attending: Obstetrics and Gynecology | Admitting: Obstetrics and Gynecology

## 2024-02-18 ENCOUNTER — Emergency Department: Payer: MEDICARE

## 2024-02-18 ENCOUNTER — Observation Stay
Admit: 2024-02-18 | Discharge: 2024-02-18 | Disposition: A | Discharge status: Home / Self Care | Payer: MEDICARE | Attending: Internal Medicine

## 2024-02-18 ENCOUNTER — Other Ambulatory Visit: Payer: Self-pay

## 2024-02-18 ENCOUNTER — Observation Stay: Payer: MEDICARE

## 2024-02-18 DIAGNOSIS — R29898 Other symptoms and signs involving the musculoskeletal system: Principal | ICD-10-CM

## 2024-02-18 DIAGNOSIS — F02A0 Dementia in other diseases classified elsewhere, mild, without behavioral disturbance, psychotic disturbance, mood disturbance, and anxiety: Secondary | ICD-10-CM | POA: Diagnosis present

## 2024-02-18 DIAGNOSIS — Z96653 Presence of artificial knee joint, bilateral: Secondary | ICD-10-CM | POA: Insufficient documentation

## 2024-02-18 DIAGNOSIS — Z7984 Long term (current) use of oral hypoglycemic drugs: Secondary | ICD-10-CM | POA: Diagnosis not present

## 2024-02-18 DIAGNOSIS — I10 Essential (primary) hypertension: Secondary | ICD-10-CM | POA: Diagnosis not present

## 2024-02-18 DIAGNOSIS — G459 Transient cerebral ischemic attack, unspecified: Secondary | ICD-10-CM

## 2024-02-18 DIAGNOSIS — M25551 Pain in right hip: Principal | ICD-10-CM | POA: Diagnosis present

## 2024-02-18 DIAGNOSIS — E1165 Type 2 diabetes mellitus with hyperglycemia: Secondary | ICD-10-CM

## 2024-02-18 DIAGNOSIS — G301 Alzheimer's disease with late onset: Secondary | ICD-10-CM | POA: Diagnosis not present

## 2024-02-18 DIAGNOSIS — E119 Type 2 diabetes mellitus without complications: Secondary | ICD-10-CM

## 2024-02-18 DIAGNOSIS — I129 Hypertensive chronic kidney disease with stage 1 through stage 4 chronic kidney disease, or unspecified chronic kidney disease: Secondary | ICD-10-CM | POA: Insufficient documentation

## 2024-02-18 DIAGNOSIS — N189 Chronic kidney disease, unspecified: Secondary | ICD-10-CM | POA: Insufficient documentation

## 2024-02-18 DIAGNOSIS — E1122 Type 2 diabetes mellitus with diabetic chronic kidney disease: Secondary | ICD-10-CM | POA: Insufficient documentation

## 2024-02-18 LAB — COMPREHENSIVE METABOLIC PANEL WITH GFR
ALT: 8 U/L (ref 0–44)
AST: 23 U/L (ref 15–41)
Albumin: 3.5 g/dL (ref 3.5–5.0)
Alkaline Phosphatase: 50 U/L (ref 38–126)
Anion gap: 9 (ref 5–15)
BUN: 26 mg/dL — ABNORMAL HIGH (ref 8–23)
CO2: 26 mmol/L (ref 22–32)
Calcium: 8.7 mg/dL — ABNORMAL LOW (ref 8.9–10.3)
Chloride: 105 mmol/L (ref 98–111)
Creatinine, Ser: 0.92 mg/dL (ref 0.44–1.00)
GFR, Estimated: 58 mL/min — ABNORMAL LOW (ref >60)
Glucose, Bld: 217 mg/dL — ABNORMAL HIGH (ref 70–99)
Potassium: 4.4 mmol/L (ref 3.5–5.1)
Sodium: 140 mmol/L (ref 135–145)
Total Bilirubin: 1.4 mg/dL — ABNORMAL HIGH (ref 0.0–1.2)
Total Protein: 6.2 g/dL — ABNORMAL LOW (ref 6.5–8.1)

## 2024-02-18 LAB — URINALYSIS, W/ REFLEX TO CULTURE (INFECTION SUSPECTED)
Bacteria, UA: NONE SEEN
Bilirubin Urine: NEGATIVE
Glucose, UA: 50 mg/dL — AB
Hgb urine dipstick: NEGATIVE
Ketones, ur: NEGATIVE mg/dL
Leukocytes,Ua: NEGATIVE
Nitrite: NEGATIVE
Protein, ur: NEGATIVE mg/dL
RBC / HPF: 0 RBC/hpf (ref 0–5)
Specific Gravity, Urine: 1.015 (ref 1.005–1.030)
Squamous Epithelial / HPF: 0 /HPF (ref 0–5)
pH: 6 (ref 5.0–8.0)

## 2024-02-18 LAB — CBC WITH DIFFERENTIAL/PLATELET
Abs Immature Granulocytes: 0.02 K/uL (ref 0.00–0.07)
Basophils Absolute: 0.1 K/uL (ref 0.0–0.1)
Basophils Relative: 1 %
Eosinophils Absolute: 0.1 K/uL (ref 0.0–0.5)
Eosinophils Relative: 1 %
HCT: 34.4 % — ABNORMAL LOW (ref 36.0–46.0)
Hemoglobin: 11 g/dL — ABNORMAL LOW (ref 12.0–15.0)
Immature Granulocytes: 0 %
Lymphocytes Relative: 31 %
Lymphs Abs: 2.4 K/uL (ref 0.7–4.0)
MCH: 29.3 pg (ref 26.0–34.0)
MCHC: 32 g/dL (ref 30.0–36.0)
MCV: 91.7 fL (ref 80.0–100.0)
Monocytes Absolute: 0.6 K/uL (ref 0.1–1.0)
Monocytes Relative: 8 %
Neutro Abs: 4.5 K/uL (ref 1.7–7.7)
Neutrophils Relative %: 59 %
Platelets: 193 K/uL (ref 150–400)
RBC: 3.75 MIL/uL — ABNORMAL LOW (ref 3.87–5.11)
RDW: 13.6 % (ref 11.5–15.5)
WBC: 7.8 K/uL (ref 4.0–10.5)
nRBC: 0 % (ref 0.0–0.2)

## 2024-02-18 LAB — ECHOCARDIOGRAM COMPLETE
AR max vel: 1.96 cm2
AV Peak grad: 8.4 mmHg
Ao pk vel: 1.45 m/s
Area-P 1/2: 2.48 cm2
Est EF: >55
Height: 62 in
MV M vel: 1.73 m/s
MV Peak grad: 12 mmHg
S' Lateral: 2.1 cm
Weight: 2080 [oz_av]

## 2024-02-18 LAB — RESP PANEL BY RT-PCR (RSV, FLU A&B, COVID)  RVPGX2
Influenza A by PCR: NEGATIVE
Influenza B by PCR: NEGATIVE
Resp Syncytial Virus by PCR: NEGATIVE
SARS Coronavirus 2 by RT PCR: NEGATIVE

## 2024-02-18 LAB — CK: Total CK: 65 U/L (ref 38–234)

## 2024-02-18 LAB — TROPONIN I (HIGH SENSITIVITY): Troponin I (High Sensitivity): 5 ng/L (ref <18)

## 2024-02-18 LAB — CBG MONITORING, ED
Glucose-Capillary: 173 mg/dL — ABNORMAL HIGH (ref 70–99)
Glucose-Capillary: 87 mg/dL (ref 70–99)

## 2024-02-18 LAB — TSH: TSH: 2.848 u[IU]/mL (ref 0.350–4.500)

## 2024-02-18 LAB — GLUCOSE, CAPILLARY: Glucose-Capillary: 135 mg/dL — ABNORMAL HIGH (ref 70–99)

## 2024-02-18 MED ORDER — STROKE: EARLY STAGES OF RECOVERY BOOK: Freq: Once | Status: AC

## 2024-02-18 MED ORDER — MECLIZINE HCL 25 MG PO TABS: 25.0000 mg | ORAL_TABLET | Freq: Three times a day (TID) | ORAL | Status: DC | PRN

## 2024-02-18 MED ORDER — ENOXAPARIN SODIUM 30 MG/0.3ML IJ SOSY
30.0000 mg | PREFILLED_SYRINGE | INTRAMUSCULAR | Status: DC
  Administered 2024-02-18 – 2024-02-20 (×3): 30 mg via SUBCUTANEOUS
  Filled 2024-02-18 (×3): qty 0.3

## 2024-02-18 MED ORDER — ACETAMINOPHEN 325 MG PO TABS
650.0000 mg | ORAL_TABLET | ORAL | Status: DC | PRN
  Administered 2024-02-18: 650 mg via ORAL
  Filled 2024-02-18: qty 2

## 2024-02-18 MED ORDER — ACETAMINOPHEN 650 MG RE SUPP: 650.0000 mg | RECTAL | Status: DC | PRN

## 2024-02-18 MED ORDER — SODIUM CHLORIDE 0.9 % IV BOLUS (SEPSIS)
1000.0000 mL | Freq: Once | INTRAVENOUS | Status: AC
  Administered 2024-02-18: 1000 mL via INTRAVENOUS

## 2024-02-18 MED ORDER — ARIPIPRAZOLE 5 MG PO TABS
5.0000 mg | ORAL_TABLET | Freq: Every day | ORAL | Status: DC
  Administered 2024-02-18 – 2024-02-20 (×3): 5 mg via ORAL
  Filled 2024-02-18 (×3): qty 1

## 2024-02-18 MED ORDER — EZETIMIBE 10 MG PO TABS
10.0000 mg | ORAL_TABLET | Freq: Every day | ORAL | Status: DC
  Administered 2024-02-18 – 2024-02-20 (×3): 10 mg via ORAL
  Filled 2024-02-18 (×3): qty 1

## 2024-02-18 MED ORDER — IPRATROPIUM BROMIDE 0.03 % NA SOLN: 2.0000 | Freq: Two times a day (BID) | NASAL | Status: DC | PRN

## 2024-02-18 MED ORDER — METFORMIN HCL ER 750 MG PO TB24
2000.0000 mg | ORAL_TABLET | Freq: Every day | ORAL | Status: DC
Start: 2024-02-18 — End: 2024-02-19
  Administered 2024-02-18 – 2024-02-19 (×2): 2000 mg via ORAL
  Filled 2024-02-18 (×2): qty 1

## 2024-02-18 MED ORDER — LATANOPROST 0.005 % OP SOLN
1.0000 [drp] | Freq: Every day | OPHTHALMIC | Status: DC
  Administered 2024-02-19: 1 [drp] via OPHTHALMIC
  Filled 2024-02-18: qty 2.5

## 2024-02-18 MED ORDER — SENNOSIDES-DOCUSATE SODIUM 8.6-50 MG PO TABS: 1.0000 | ORAL_TABLET | Freq: Every evening | ORAL | Status: DC | PRN

## 2024-02-18 MED ORDER — ACETAMINOPHEN 500 MG PO TABS
1000.0000 mg | ORAL_TABLET | Freq: Once | ORAL | Status: AC
  Administered 2024-02-18: 1000 mg via ORAL
  Filled 2024-02-18: qty 2

## 2024-02-18 MED ORDER — LISINOPRIL 20 MG PO TABS
20.0000 mg | ORAL_TABLET | Freq: Every day | ORAL | Status: DC
Start: 2024-02-18 — End: 2024-02-20
  Administered 2024-02-18 – 2024-02-20 (×3): 20 mg via ORAL
  Filled 2024-02-18 (×2): qty 1
  Filled 2024-02-18: qty 4

## 2024-02-18 MED ORDER — ACETAMINOPHEN 325 MG PO TABS
325.0000 mg | ORAL_TABLET | Freq: Every day | ORAL | Status: DC
  Administered 2024-02-19 – 2024-02-20 (×2): 325 mg via ORAL
  Filled 2024-02-18 (×3): qty 1

## 2024-02-18 MED ORDER — SODIUM CHLORIDE 0.9 % IV SOLN: INTRAVENOUS | Status: AC

## 2024-02-18 MED ORDER — ONDANSETRON HCL 4 MG/2ML IJ SOLN
4.0000 mg | Freq: Once | INTRAMUSCULAR | Status: AC
  Administered 2024-02-18: 4 mg via INTRAVENOUS
  Filled 2024-02-18: qty 2

## 2024-02-18 MED ORDER — INSULIN ASPART 100 UNIT/ML IJ SOLN
0.0000 [IU] | Freq: Three times a day (TID) | INTRAMUSCULAR | Status: DC
  Administered 2024-02-18: 2 [IU] via SUBCUTANEOUS
  Administered 2024-02-19: 3 [IU] via SUBCUTANEOUS
  Administered 2024-02-19: 2 [IU] via SUBCUTANEOUS
  Filled 2024-02-18: qty 1
  Filled 2024-02-18: qty 2
  Filled 2024-02-18: qty 1

## 2024-02-18 MED ORDER — ACETAMINOPHEN 160 MG/5ML PO SOLN: 650.0000 mg | ORAL | Status: DC | PRN

## 2024-02-18 NOTE — H&P (Addendum)
 History and Physical    Kristin Deleon FMW:[id number] DOB: 1927/10/18 DOA: 02/18/2024  PCP: Abdul Fine, MD (Confirm with patient/family/NH records and if not entered, this has to be entered at [facility] point of entry) Patient coming from: Assisted living  I have personally briefly reviewed patient's old medical records in [facility]  Chief Complaint: Feeling better  HPI: Kristin Deleon is a 89 y.o. female with medical history significant of multiple OA's with bilateral TKA and chronic ambulation impairment on roller walker, IIDM, COPD, came in with right hip pain and right leg weakness.  Patient started to have right hip pain for the last several days, she also complained about feeling weak of right lower extremities same time and decided to come to ED.  In the ED, ED physician found patient has significant weakness of right lower extremity but minimum right hip pain.  MRI was ordered which showed negative for stroke.  Right hip MRI is pending.  Blood work showed WBC 7.8 hemoglobin 11.0, BUN 26 creatinine 0.9 glucose 217. UA negative for UTI ED tried to walk the patient however patient appears to be too weak to ambulate  Review of Systems: As per HPI otherwise 14 point review of systems negative.    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   Diabetes mellitus without complication (HCC)    metformin -oral meds   HOH (hard of hearing)    hearing aids/ bilateral   Hypercholesteremia    Hypertension    moderate/ controlled on meds   Meniere syndrome    PONV (postoperative nausea and vomiting)    difficulty waking up    Past Surgical History:  Procedure Laterality Date   ABDOMINAL HYSTERECTOMY     CATARACT EXTRACTION     CATARACT EXTRACTION W/PHACO Right 09/19/2016   Procedure: CATARACT EXTRACTION PHACO AND INTRAOCULAR LENS PLACEMENT (IOC) Diabetic  Right eye;  Surgeon: Dene Etienne, MD;  Location: [facility];  Service:  Ophthalmology;  Laterality: Right;  Diabetic-oral med   CERVICAL FUSION     for fracture C1 and C2   CHOLECYSTECTOMY     ESOPHAGOGASTRODUODENOSCOPY (EGD) WITH PROPOFOL  N/A 05/28/2017   Procedure: ESOPHAGOGASTRODUODENOSCOPY (EGD) WITH PROPOFOL ;  Surgeon: Toledo, Ladell POUR, MD;  Location: [facility];  Service: Gastroenterology;  Laterality: N/A;   JOINT REPLACEMENT Bilateral    knee and hip   KNEE ARTHROSCOPY     multiple   Morton's neuroma removed from foot     ROTATOR CUFF REPAIR Right    TONSILLECTOMY       reports that she has never smoked. She has never used smokeless tobacco. She reports current alcohol use of about 2.0 standard drinks of alcohol per week. She reports that she does not use drugs.  Allergies  Allergen Reactions   Amlodipine Swelling    Lower leg edema   Tramadol Nausea Only   Codeine Nausea And Vomiting   Fentanyl  Nausea And Vomiting   Hydromorphone Hcl Nausea And Vomiting   Morphine Itching and Nausea And Vomiting    Other reaction(s): Vomiting/ feels like ants under skin   Nucynta [Tapentadol] Other (See Comments)    hallucinations   Statins     Muscle pain    Tegaderm Ag Mesh [Silver] Other (See Comments)    blisters   Tetracaine Itching   Tetracyclines & Related Rash    Family History  Problem Relation Age of Onset   Hypertension Sister    Diabetes  Sister    Thyroid  disease Sister    Bipolar disorder Son    Diabetes Maternal Grandmother      Prior to Admission medications   Medication Sig Start Date End Date Taking? Authorizing Provider  Acetaminophen  325 MG CAPS Take by mouth.   Yes [provider]  ARIPiprazole  (ABILIFY ) 5 MG tablet Take 1 tablet (5 mg total) by mouth at bedtime. 09/06/23  Yes Abdul Fine, MD  ezetimibe  (ZETIA ) 10 MG tablet Take 1 tablet (10 mg total) by mouth daily. 09/06/23  Yes Beamer, Fine, MD  latanoprost  (XALATAN ) 0.005 % ophthalmic solution Place 1 drop into both eyes at bedtime. 1 drop per eye  hour of sleep 09/06/23  Yes Beamer, [place], MD  metformin  (FORTAMET ) 1000 MG (OSM) 24 hr tablet Take 2 tablets (2,000 mg total) by mouth daily with breakfast. 09/13/23  Yes Beamer, [place], MD  Multiple Vitamins-Minerals (CENTRUM SILVER 50+WOMEN PO) Take by mouth. am   Yes [provider]  vitamin B-12 (CYANOCOBALAMIN) 500 MCG tablet Take 1 tablet (500 mcg total) by mouth daily. dinner 09/06/23  Yes Beamer, [place], MD  glucose blood test strip Use to test blood sugar twice daily. Dx:E11.65 12/10/23   Abdul Fine, MD  ipratropium (ATROVENT ) 0.03 % nasal spray Place 2 sprays into both nostrils 2 (two) times daily. Patient not taking: Reported on 02/18/2024    [provider]  meclizine  (ANTIVERT ) 25 MG tablet Take 1 tablet (25 mg total) by mouth 3 (three) times daily as needed for dizziness. Patient not taking: Reported on 02/18/2024 01/18/23   Lang Dover, MD  triamcinolone cream (KENALOG) 0.1 % Apply 1 application topically as needed. Apply externally to the affected area twice daily Patient not taking: Reported on 02/18/2024    [provider]    Physical Exam: Vitals:   02/18/24 0215 02/18/24 0219 02/18/24 0300 02/18/24 0600  BP: (!) 158/88  136/71 (!) 144/81  Pulse: 78  71 70  Resp: 19  13 14   Temp: 97.8 F (36.6 C)     TempSrc: Oral     SpO2: 98%  94% 99%  Weight:  59 kg    Height:  5' 2 (1.575 m)      Constitutional: NAD, calm, comfortable Vitals:   02/18/24 0215 02/18/24 0219 02/18/24 0300 02/18/24 0600  BP: (!) 158/88  136/71 (!) 144/81  Pulse: 78  71 70  Resp: 19  13 14   Temp: 97.8 F (36.6 C)     TempSrc: Oral     SpO2: 98%  94% 99%  Weight:  59 kg    Height:  5' 2 (1.575 m)     Eyes: PERRL, lids and conjunctivae normal ENMT: Mucous membranes are moist. Posterior pharynx clear of any exudate or lesions.Normal dentition.  Neck: normal, supple, no masses, no thyromegaly Respiratory: clear to auscultation bilaterally, no wheezing, no  crackles. Normal respiratory effort. No accessory muscle use.  Cardiovascular: Regular rate and rhythm, no murmurs / rubs / gallops. No extremity edema. 2+ pedal pulses. No carotid bruits.  Abdomen: no tenderness, no masses palpated. No hepatosplenomegaly. Bowel sounds positive.  Musculoskeletal: no clubbing / cyanosis. No joint deformity upper and lower extremities. Good ROM, no contractures. Normal muscle tone.  Skin: no rashes, lesions, ulcers. No induration Neurologic: CN 2-12 grossly intact. Sensation intact, DTR normal. Strength 5/5 in all 4.  Psychiatric: Normal judgment and insight. Alert and oriented x 3. Normal mood.     Labs on Admission: I have personally reviewed following  labs and imaging studies  CBC: Recent Labs  Lab 02/18/24 0237  WBC 7.8  NEUTROABS 4.5  HGB 11.0*  HCT 34.4*  MCV 91.7  PLT 193   Basic Metabolic Panel: Recent Labs  Lab 02/18/24 0237  NA 140  K 4.4  CL 105  CO2 26  GLUCOSE 217*  BUN 26*  CREATININE 0.92  CALCIUM 8.7*   GFR: Estimated Creatinine Clearance: 29.6 mL/min (by C-G formula based on SCr of 0.92 mg/dL). Liver Function Tests: Recent Labs  Lab 02/18/24 0237  AST 23  ALT 8  ALKPHOS 50  BILITOT 1.4*  PROT 6.2*  ALBUMIN 3.5   No results for input(s): LIPASE, AMYLASE in the last 168 hours. No results for input(s): AMMONIA in the last 168 hours. Coagulation Profile: No results for input(s): INR, PROTIME in the last 168 hours. Cardiac Enzymes: No results for input(s): CKTOTAL, CKMB, CKMBINDEX, TROPONINI in the last 168 hours. BNP (last 3 results) No results for input(s): PROBNP in the last 8760 hours. HbA1C: No results for input(s): HGBA1C in the last 72 hours. CBG: No results for input(s): GLUCAP in the last 168 hours. Lipid Profile: No results for input(s): CHOL, HDL, LDLCALC, TRIG, CHOLHDL, LDLDIRECT in the last 72 hours. Thyroid  Function Tests: Recent Labs    02/18/24 0237  TSH  2.848   Anemia Panel: No results for input(s): VITAMINB12, FOLATE, FERRITIN, TIBC, IRON, RETICCTPCT in the last 72 hours. Urine analysis:    Component Value Date/Time   COLORURINE STRAW (A) 02/18/2024 0424   APPEARANCEUR CLEAR (A) 02/18/2024 0424   LABSPEC 1.015 02/18/2024 0424   PHURINE 6.0 02/18/2024 0424   GLUCOSEU 50 (A) 02/18/2024 0424   HGBUR NEGATIVE 02/18/2024 0424   BILIRUBINUR NEGATIVE 02/18/2024 0424   BILIRUBINUR small 08/15/2023 1116   KETONESUR NEGATIVE 02/18/2024 0424   PROTEINUR NEGATIVE 02/18/2024 0424   UROBILINOGEN negative (A) 08/15/2023 1116   NITRITE NEGATIVE 02/18/2024 0424   LEUKOCYTESUR NEGATIVE 02/18/2024 0424    Radiological Exams on Admission: MR HIP RIGHT WO CONTRAST Result Date: 02/18/2024 CLINICAL DATA:  Right hip pain and weakness after falling. Clinical concern or fracture. EXAM: MR OF THE RIGHT HIP WITHOUT CONTRAST TECHNIQUE: Multiplanar, multisequence MR imaging was performed. No intravenous contrast was administered. COMPARISON:  Radiographs 02/18/2024 FINDINGS: Bones: Patient is status post bilateral total hip arthroplasty. There is associated moderate susceptibility artifact. Allowing for this artifact, no evidence of acute fracture or dislocation. The visualized bony pelvis appears normal. The visualized sacroiliac joints and symphysis pubis appear normal. Joint or bursal effusion Joint effusion: No significant hip joint effusion. Bursae: There is prominent soft tissue edema surrounding the proximal right femur, further described below. No focal periarticular fluid collection identified. Muscles and tendons Muscles and tendons: There is a large amount of edema within and surrounding the right iliopsoas muscle and tendon. The tendon appears completely avulsed from the lesser trochanter with surrounding ill-defined fluid. There is lesser edema within the proximal right thigh adductor musculature. The left iliopsoas, bilateral common hamstring  and gluteus tendons appear intact. There is mild gluteus tendinosis bilaterally. Other findings Miscellaneous: Prominent subcutaneous fat within the left groin, measuring up to 2.9 cm on coronal image 32/21, favored to reflect a lipoma over a fat containing hernia. 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 IMPRESSION: 1. Complete avulsion of the right iliopsoas tendon from the lesser trochanter with surrounding soft tissue edema. 2. No evidence of acute fracture or dislocation. 3. Status post bilateral total hip arthroplasty. 4. Prominent subcutaneous  fat within the left groin, favored to reflect a lipoma over a fat containing hernia. Correlate clinically. Electronically Signed   By: Elsie Perone M.D.   On: 02/18/2024 08:44   MR BRAIN WO CONTRAST Result Date: 02/18/2024 CLINICAL DATA:  89 year old female with altered mental status. Weakness. Dizziness. EXAM: MRI HEAD WITHOUT CONTRAST TECHNIQUE: Multiplanar, multiecho pulse sequences of the brain and surrounding structures were obtained without intravenous contrast. COMPARISON:  Head CT 01/18/2023 and earlier. Cervical spine CT 06/18/2022. FINDINGS: Brain: No restricted diffusion to suggest acute infarction. No midline shift, mass effect, evidence of mass lesion, ventriculomegaly, extra-axial collection or acute intracranial hemorrhage. 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 Stable cerebral volume since last year. Metal hardware susceptibility artifact at the craniocervical junction. Patchy and confluent bilateral cerebral white matter T2 and FLAIR hyperintensity. Scattered small chronic infarcts in the bilateral cerebellum. Comparatively mild T2 heterogeneity in the pons. Mild T2 and FLAIR heterogeneity in the deep gray nuclei. No cortical encephalomalacia identified. No definite chronic cerebral blood products on SWI. Vascular: Major intracranial vascular flow voids are preserved. Some generalized intracranial artery tortuosity, and dominant appearing distal left  vertebral artery. Skull and upper cervical spine: Chronically abnormal craniocervical junction with posterior fusion hardware, left suboccipital bone screw contacting only a small portion of the left C1 ring as demonstrated by CT last year. But there has been evidence of multilevel upper cervical arthrodesis. Underlying chronic displaced odontoid fracture. And subsequent multifactorial stenosis at the cervicomedullary junction (series 7, image 12). No lower brainstem encephalomalacia is evident. And grossly maintained signal and volume of the visible cervical spinal cord. Normal background bone marrow signal. Sinuses/Orbits: Postoperative changes to the globes. Mild to moderate left maxillary sinus mucosal thickening. Other paranasal sinuses and mastoids are well aerated. Other: Grossly normal visible internal auditory structures. Chronic postoperative changes to the suboccipital region, posterior neck. IMPRESSION: 1. No acute intracranial abnormality. 2. Chronic small vessel disease, most pronounced in the bilateral cerebellum and cerebral white matter. 3. Advanced chronic posttraumatic and postoperative deformity at the craniocervical junction, subsequent chronic cervicomedullary junction stenosis. Electronically Signed   By: VEAR Hurst M.D.   On: 02/18/2024 07:25   DG Hip Unilat W or Wo Pelvis 2-3 Views Right Result Date: 02/18/2024 EXAM: 2 or 3 VIEW(S) XRAY OF THE PELVIS AND RIGHT HIP 02/18/2024 05:50:43 AM COMPARISON: None available. CLINICAL HISTORY: BIB [facility] from [facility] with CC of R hip pain. Upon assessing pt, answers are not consistent. Pt is able to make conversation flow but content is often not relevant to CC. At this time pt is oriented to person and place only. FINDINGS: JOINTS: The patient is status post bilateral total hip arthroplasty. No signs of periprosthetic fracture or dislocation. SOFT TISSUES: Moderate distention of the bowel loops with gas and stool. Lateral extension of  the left lower quadrant bowel loops beyond the margins of the left iliac bone may reflect underlying hernia. VASCULATURE: Vascular calcifications noted. DISCS/DEGENERATIVE CHANGES: Degenerative changes noted within the imaged portions of the lumbar spine. IMPRESSION: 1. No acute abnormality of the right hip. 2. Status post bilateral total hip arthroplasty without signs of periprosthetic fracture or dislocation. 3. Moderate distention of the bowel loops with gas and stool. Lateral extension of the left lower quadrant bowel loops beyond the margins of the left iliac bone may reflect underlying hernia. Electronically signed by: Waddell Calk MD 02/18/2024 06:18 AM EDT RP Workstation: [id number]    EKG: Independently reviewed.  Sinus rhythm, chronic RBBB, no acute ST changes.  Assessment/Plan  Principal Problem:   TIA (transient ischemic attack)  (please populate well all problems here in Problem List. (For example, if patient is on BP meds at home and you resume or decide to hold them, it is a problem that needs to be her. Same for CAD, COPD, HLD and so on)  Acute on chronic ambulatory impairment Transient right lower extremity weakness TIA, stroke ruled out, ABCD2=6 - MRI negative for stroke - ED physician found the patient has significant weakness of right lower extremity however at the time I examined patient she has symmetrical muscle strength is 4/5 on both legs. -ABCD2=6, 7 days stroke risk 11.6% -Unknown LKW, not candidate for TNK - Continue aspirin and Zetia  - Carotid Doppler - Echocardiogram - Telemonitoring x 24 hours  IIDM - Continue metformin  - SSI -Add lisinopril   Hypothyroidism - Continue Synthroid  Right-sided hip pain Right iliopsoas tendon avulsion - MRI showed complete avulsion of right iliopsoas tendon from the lesser trochanter with surrounding soft tissue edema. Ortho recommends no surgical intervention, gradually scar in and heal this tendon avulsion. It may be six  to eight weeks of uncomfortable weight bearing however, but she can be weight-bearing as tolerated. start PT evaluation  Hx of C1-C3 fracutre -Per daughter, pt had a failed c1-c5 fusion where a screw is pressing into an artery and pt is supposed to be wearing a soft collar at daytime. PT informed.  DVT prophylaxis: Lovenox  Code Status: DNR Family Communication: None [place] Disposition Plan: Expect less than 2 midnight hospital stay Consults called: Curbside consult with orthopedic surgery Admission status: Telemetry observation   Cort ONEIDA Mana MD [facility] Pager [phone]  02/18/2024, 9:19 AM

## 2024-02-18 NOTE — ED Notes (Signed)
 Per daughter, pt had a failed c1-c5 fusion where a screw is pressing into an artery and pt is supposed to be wearing a soft collar when she is not asleep. Daughter is going to bring pt's collar from home.  Daughter would like the hosptialist team to give her a call and let her know what is going on.

## 2024-02-18 NOTE — ED Notes (Signed)
 CCMD called to initiate cardiac monitoring.

## 2024-02-18 NOTE — ED Triage Notes (Signed)
 BIB [facility] from [facility] with CC of R hip pain. Upon assessing pt, answers are not consistent. Pt is able to make conversation flow but content is often not relevant to CC. At this time pt is oriented to person and place only. EMS started 22G IV in L FA - administered 4mg  Zofran  for nausea and 75mcg for R hip pain.

## 2024-02-18 NOTE — Progress Notes (Signed)
 Echocardiogram 2D Echocardiogram has been performed.  Kristin Deleon 02/18/2024, 12:52 PM

## 2024-02-18 NOTE — ED Provider Notes (Signed)
 [facility] Provider Note    Event Date/Time   First MD Initiated Contact with Patient 02/18/24 0208     (approximate)   History   Weakness   HPI  LABRENDA Deleon is a 89 y.o. female with history of mild cognitive impairment, hypertension, diabetes, hyperlipidemia, chronic kidney disease who presents to the emergency department with complaints of weakness.  EMS reports that she told them that she was having right hip pain but denies any falls.  Initially denied any right hip pain to me but then states that it was uncomfortable when she tries to walk.  She is able to bear weight but states that she is not able to make her leg move.  She denies any numbness, tingling.  No headache or head injury.  No fevers, chest pain, shortness of breath, vomiting, diarrhea, urinary symptoms.  She lives at home alone at [facility] independent living.  It appears that this is quite different than her baseline and that she is very active and goes to the gym 3 times a week.   History provided by patient, EMS.    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   Diabetes mellitus without complication (HCC)    metformin -oral meds   HOH (hard of hearing)    hearing aids/ bilateral   Hypercholesteremia    Hypertension    moderate/ controlled on meds   Meniere syndrome    PONV (postoperative nausea and vomiting)    difficulty waking up    Past Surgical History:  Procedure Laterality Date   ABDOMINAL HYSTERECTOMY     CATARACT EXTRACTION     CATARACT EXTRACTION W/PHACO Right 09/19/2016   Procedure: CATARACT EXTRACTION PHACO AND INTRAOCULAR LENS PLACEMENT (IOC) Diabetic  Right eye;  Surgeon: Dene Etienne, MD;  Location: [facility];  Service: Ophthalmology;  Laterality: Right;  Diabetic-oral med   CERVICAL FUSION     for fracture C1 and C2   CHOLECYSTECTOMY     ESOPHAGOGASTRODUODENOSCOPY (EGD) WITH PROPOFOL  N/A 05/28/2017   Procedure:  ESOPHAGOGASTRODUODENOSCOPY (EGD) WITH PROPOFOL ;  Surgeon: Toledo, Ladell POUR, MD;  Location: [facility];  Service: Gastroenterology;  Laterality: N/A;   JOINT REPLACEMENT Bilateral    knee and hip   KNEE ARTHROSCOPY     multiple   Morton's neuroma removed from foot     ROTATOR CUFF REPAIR Right    TONSILLECTOMY      MEDICATIONS:  Prior to Admission medications   Medication Sig Start Date End Date Taking? Authorizing Provider  Acetaminophen  325 MG CAPS Take by mouth.    [provider]  ARIPiprazole  (ABILIFY ) 5 MG tablet Take 1 tablet (5 mg total) by mouth at bedtime. 09/06/23   Abdul Fine, MD  ezetimibe  (ZETIA ) 10 MG tablet Take 1 tablet (10 mg total) by mouth daily. 09/06/23   Abdul Fine, MD  glucose blood test strip Use to test blood sugar twice daily. Dx:E11.65 12/10/23   Abdul Fine, MD  ipratropium (ATROVENT ) 0.03 % nasal spray Place 2 sprays into both nostrils 2 (two) times daily.    [provider]  latanoprost  (XALATAN ) 0.005 % ophthalmic solution Place 1 drop into both eyes at bedtime. 1 drop per eye hour of sleep 09/06/23   Abdul Fine, MD  meclizine  (ANTIVERT ) 25 MG tablet Take 1 tablet (25 mg total) by mouth 3 (three) times daily as needed for dizziness. 01/18/23   Lang Dover, MD  metformin  (FORTAMET ) 1000  MG (OSM) 24 hr tablet Take 2 tablets (2,000 mg total) by mouth daily with breakfast. 09/13/23   Abdul Fine, MD  Multiple Vitamins-Minerals (CENTRUM SILVER 50+WOMEN PO) Take by mouth. am    [provider]  triamcinolone cream (KENALOG) 0.1 % Apply 1 application topically as needed. Apply externally to the affected area twice daily    [provider]  vitamin B-12 (CYANOCOBALAMIN) 500 MCG tablet Take 1 tablet (500 mcg total) by mouth daily. dinner 09/06/23   Abdul Fine, MD    Physical Exam   Triage Vital Signs: ED Triage Vitals  Encounter Vitals Group     BP 02/18/24 0215 (!) 158/88     Girls Systolic  BP Percentile --      Girls Diastolic BP Percentile --      Boys Systolic BP Percentile --      Boys Diastolic BP Percentile --      Pulse Rate 02/18/24 0215 78     Resp 02/18/24 0215 19     Temp 02/18/24 0215 97.8 F (36.6 C)     Temp Source 02/18/24 0215 Oral     SpO2 02/18/24 0215 98 %     Weight 02/18/24 0219 130 lb (59 kg)     Height 02/18/24 0219 5' 2 (1.575 m)     Head Circumference --      Peak Flow --      Pain Score 02/18/24 0218 0     Pain Loc --      Pain Education --      Exclude from Growth Chart --      Most recent vital signs: Vitals:   02/18/24 0300 02/18/24 0600  BP: 136/71 (!) 144/81  Pulse: 71 70  Resp: 13 14  Temp:    SpO2: 94% 99%    CONSTITUTIONAL: Alert, responds appropriately to questions.  Elderly, thin, chronically ill-appearing HEAD: Normocephalic, atraumatic EYES: Conjunctivae clear, pupils appear equal, sclera nonicteric ENT: normal nose; moist mucous membranes NECK: Supple, normal ROM CARD: RRR; S1 and S2 appreciated RESP: Normal chest excursion without splinting or tachypnea; breath sounds clear and equal bilaterally; no wheezes, no rhonchi, no rales, no hypoxia or respiratory distress, speaking full sentences ABD/GI: Non-distended; soft, non-tender, no rebound, no guarding, no peritoneal signs BACK: The back appears normal EXT: Normal ROM in all joints; no deformity noted, no edema, when patient is standing she has a hard time moving her right leg forward but in the bed there is no pain with flexion, extension, internal or external rotation.  She has 2+ DP pulses bilaterally.  No calf tenderness or calf swelling. SKIN: Normal color for age and race; warm; no rash on exposed skin NEURO: Moves all extremities equally, normal speech, reports normal sensation diffusely, unable to move the right leg forward with ambulation but can bear weight PSYCH: The patient's mood and manner are appropriate.   ED Results / Procedures / Treatments    LABS: (all labs ordered are listed, but only abnormal results are displayed) Labs Reviewed  CBC WITH DIFFERENTIAL/PLATELET - Abnormal; Notable for the following components:      Result Value   RBC 3.75 (*)    Hemoglobin 11.0 (*)    HCT 34.4 (*)    All other components within normal limits  COMPREHENSIVE METABOLIC PANEL WITH GFR - Abnormal; Notable for the following components:   Glucose, Bld 217 (*)    BUN 26 (*)    Calcium 8.7 (*)    Total Protein 6.2 (*)  Total Bilirubin 1.4 (*)    GFR, Estimated 58 (*)    All other components within normal limits  URINALYSIS, W/ REFLEX TO CULTURE (INFECTION SUSPECTED) - Abnormal; Notable for the following components:   Color, Urine STRAW (*)    APPearance CLEAR (*)    Glucose, UA 50 (*)    All other components within normal limits  RESP PANEL BY RT-PCR (RSV, FLU A&B, COVID)  RVPGX2  TSH  TROPONIN I (HIGH SENSITIVITY)     EKG:  EKG Interpretation Date/Time:  Tuesday February 18 2024 02:34:56 EDT Ventricular Rate:  78 PR Interval:  238 QRS Duration:  146 QT Interval:  428 QTC Calculation: 488 R Axis:   251  Text Interpretation: Sinus rhythm Atrial premature complex Prolonged PR interval RBBB and LAFB No significant change since last tracing Confirmed by Neomi Neptune [phone]) on 02/18/2024 2:47:36 AM         RADIOLOGY: My personal review and interpretation of imaging: MRI brain shows no acute abnormality.  X-ray of the right hip shows degenerative changes, hip replacement.  I have personally reviewed all radiology reports.   MR BRAIN WO CONTRAST Result Date: 02/18/2024 CLINICAL DATA:  89 year old female with altered mental status. Weakness. Dizziness. EXAM: MRI HEAD WITHOUT CONTRAST TECHNIQUE: Multiplanar, multiecho pulse sequences of the brain and surrounding structures were obtained without intravenous contrast. COMPARISON:  Head CT 01/18/2023 and earlier. Cervical spine CT 06/18/2022. FINDINGS: Brain: No restricted diffusion  to suggest acute infarction. No midline shift, mass effect, evidence of mass lesion, ventriculomegaly, extra-axial collection or acute intracranial hemorrhage. 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 Stable cerebral volume since last year. Metal hardware susceptibility artifact at the craniocervical junction. Patchy and confluent bilateral cerebral white matter T2 and FLAIR hyperintensity. Scattered small chronic infarcts in the bilateral cerebellum. Comparatively mild T2 heterogeneity in the pons. Mild T2 and FLAIR heterogeneity in the deep gray nuclei. No cortical encephalomalacia identified. No definite chronic cerebral blood products on SWI. Vascular: Major intracranial vascular flow voids are preserved. Some generalized intracranial artery tortuosity, and dominant appearing distal left vertebral artery. Skull and upper cervical spine: Chronically abnormal craniocervical junction with posterior fusion hardware, left suboccipital bone screw contacting only a small portion of the left C1 ring as demonstrated by CT last year. But there has been evidence of multilevel upper cervical arthrodesis. Underlying chronic displaced odontoid fracture. And subsequent multifactorial stenosis at the cervicomedullary junction (series 7, image 12). No lower brainstem encephalomalacia is evident. And grossly maintained signal and volume of the visible cervical spinal cord. Normal background bone marrow signal. Sinuses/Orbits: Postoperative changes to the globes. Mild to moderate left maxillary sinus mucosal thickening. Other paranasal sinuses and mastoids are well aerated. Other: Grossly normal visible internal auditory structures. Chronic postoperative changes to the suboccipital region, posterior neck. IMPRESSION: 1. No acute intracranial abnormality. 2. Chronic small vessel disease, most pronounced in the bilateral cerebellum and cerebral white matter. 3. Advanced chronic posttraumatic and postoperative deformity at the craniocervical junction,  subsequent chronic cervicomedullary junction stenosis. Electronically Signed   By: VEAR Hurst M.D.   On: 02/18/2024 07:25   DG Hip Unilat W or Wo Pelvis 2-3 Views Right Result Date: 02/18/2024 EXAM: 2 or 3 VIEW(S) XRAY OF THE PELVIS AND RIGHT HIP 02/18/2024 05:50:43 AM COMPARISON: None available. CLINICAL HISTORY: BIB [facility] from [facility] with CC of R hip pain. Upon assessing pt, answers are not consistent. Pt is able to make conversation flow but content is often not relevant to CC. At this time pt  is oriented to person and place only. FINDINGS: JOINTS: The patient is status post bilateral total hip arthroplasty. No signs of periprosthetic fracture or dislocation. SOFT TISSUES: Moderate distention of the bowel loops with gas and stool. Lateral extension of the left lower quadrant bowel loops beyond the margins of the left iliac bone may reflect underlying hernia. VASCULATURE: Vascular calcifications noted. DISCS/DEGENERATIVE CHANGES: Degenerative changes noted within the imaged portions of the lumbar spine. IMPRESSION: 1. No acute abnormality of the right hip. 2. Status post bilateral total hip arthroplasty without signs of periprosthetic fracture or dislocation. 3. Moderate distention of the bowel loops with gas and stool. Lateral extension of the left lower quadrant bowel loops beyond the margins of the left iliac bone may reflect underlying hernia. Electronically signed by: Waddell Calk MD 02/18/2024 06:18 AM EDT RP Workstation: [id number]     PROCEDURES:  Critical Care performed: No     .1-3 Lead EKG Interpretation  Performed by: Skarlette Lattner, Josette SAILOR, DO Authorized by: Chiann Goffredo, Josette SAILOR, DO     Interpretation: normal     ECG rate:  70   ECG rate assessment: normal     Rhythm: sinus rhythm     Ectopy: none     Conduction: normal       IMPRESSION / MDM / ASSESSMENT AND PLAN / ED COURSE  I reviewed the triage vital signs and the nursing notes.    Patient here with complaints  of weakness in the right leg and pain.  The patient is on the cardiac monitor to evaluate for evidence of arrhythmia and/or significant heart rate changes.   DIFFERENTIAL DIAGNOSIS (includes but not limited to):   Fracture, arthritis, less likely septic arthritis or gout, differential also includes anemia, electrolyte derangement, UTI, dehydration, COVID, stroke, intracranial hemorrhage   Patient's presentation is most consistent with acute presentation with potential threat to life or bodily function.   PLAN: Will obtain labs, urine, x-ray of the right hip, MRI of the brain.  Will give fluids, Tylenol  for pain.   MEDICATIONS GIVEN IN ED: Medications  sodium chloride  0.9 % bolus 1,000 mL (0 mLs Intravenous Stopped 02/18/24 0500)  ondansetron  (ZOFRAN ) injection 4 mg (4 mg Intravenous Given 02/18/24 0243)  acetaminophen  (TYLENOL ) tablet 1,000 mg (1,000 mg Oral Given 02/18/24 0643)     ED COURSE: Labs show normal hemoglobin, electrolytes.  COVID, flu and RSV negative.  Urine does not appear infected.  Normal TSH.  X-ray of the right hip reviewed and interpreted by myself and the radiologist and shows degenerative changes but no other acute abnormality.  She is able to stand with assistance but unable to move her right leg forward.  MRI of the brain was obtained and reviewed/interpreted by myself and the radiologist and shows no acute stroke.  I did also obtain an MRI of the hip which is currently pending.  I feel she will need admission to the hospital for pain control, physical therapy.  Patient does have multiple allergies to narcotic pain medication.  Will see how she is doing after a dose of Tylenol  here.  CONSULTS:  Consulted and discussed patient's case with hospitalist, Dr. Laurita.  I have recommended admission and consulting physician agrees and will place admission orders.  Patient (and family if present) agree with this plan.   I reviewed all nursing notes, vitals, pertinent previous  records.  All labs, EKGs, imaging ordered have been independently reviewed and interpreted by myself.    OUTSIDE RECORDS REVIEWED: Reviewed recent endocrinology and  internal medicine notes.       FINAL CLINICAL IMPRESSION(S) / ED DIAGNOSES   Final diagnoses:  Weakness of right lower extremity     Rx / DC Orders   ED Discharge Orders     None        Note:  This document was prepared using Dragon voice recognition software and may include unintentional dictation errors.   Taite Schoeppner, Josette SAILOR, DO 02/18/24 [phone]

## 2024-02-18 NOTE — Evaluation (Signed)
 Physical Therapy Evaluation Patient Details Name: Kristin Deleon MRN: [id number] DOB: 31-Mar-1928 Today's Date: 02/18/2024  History of Present Illness  Pt is a 89 y/o F presenting to ED with c/o R hip pain and weakness. MRI of R hip found R iliopsoas tendon avulsion. 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  Clinical Impression  Pt alert, oriented to person, place, situation, agreeable to PT/OT co-evaluation. Pt cited pain in R hip 4.5/10 intensity with mobility. At baseline, pt reports that she is modI with RW or no AD, denies hx of falls, and IND with basic ADLs. Pt completed bed mobility with modAx1-2 for trunk control/BLE assist into/out of bed. STS from EOB and chair in room with RW and CGA. Pt amb ~34ft in room with RW and CGA, demonstrated difficulty with advancing RLE throughout amb with minimal foot clearance, no LOB. Pt was left supine in bed, all needs within reach, supportive daughter at bedside. Pt is currently displaying deficits in RLE ROM/strength, activity tolerance, and functional mobility and would benefit from skilled PT intervention to address listed deficits and allow for safe return to PLOF.         If plan is discharge home, recommend the following: A lot of help with walking and/or transfers;A lot of help with bathing/dressing/bathroom;Assistance with cooking/housework;Assist for transportation;Direct supervision/assist for medications management   Can travel by private vehicle   Yes    Equipment Recommendations Other (comment) (TBD)  Recommendations for Other Services       Functional Status Assessment Patient has had a recent decline in their functional status and demonstrates the ability to make significant improvements in function in a reasonable and predictable amount of time.     Precautions / Restrictions Precautions Precautions: Fall Recall of Precautions/Restrictions:  Intact Precaution/Restrictions Comments: Per pt's daughter, pt has a soft collar that she wears when OOB due to failed C1-3 fusion Required Braces or Orthoses: Cervical Brace Cervical Brace: Soft collar;Other (comment) (when OOB) Restrictions Weight Bearing Restrictions Per Provider Order: Yes RLE Weight Bearing Per Provider Order: Weight bearing as tolerated      Mobility  Bed Mobility Overal bed mobility: Needs Assistance Bed Mobility: Supine to Sit, Sit to Supine     Supine to sit: Mod assist, HOB elevated Sit to supine: Mod assist   General bed mobility comments: modA supine > sit for unilateral UE support to pull to sitting, modAx2 for sit> supine to assist BLE into bed and control trunk    Transfers Overall transfer level: Needs assistance Equipment used: Rolling walker (2 wheels) Transfers: Sit to/from Stand Sit to Stand: Contact guard assist           General transfer comment: STS from EOB and chair in room with RW and CGA    Ambulation/Gait Ambulation/Gait assistance: Contact guard assist Gait Distance (Feet): 30 Feet Assistive device: Rolling walker (2 wheels) Gait Pattern/deviations: Step-through pattern, Trunk flexed, Narrow base of support Gait velocity: decreased     General Gait Details: no LOB, difficulty advancing RLE in swing with minimal foot clearance  Stairs            Wheelchair Mobility     Tilt Bed    Modified Rankin (Stroke Patients Only)       Balance Overall balance assessment: Needs assistance Sitting-balance support: Feet supported Sitting balance-Leahy Scale: Good Sitting balance - Comments: good static and dynamic sitting   Standing balance support: Bilateral upper extremity supported,  Reliant on assistive device for balance Standing balance-Leahy Scale: Good Standing balance comment: no LOB                             Pertinent Vitals/Pain Pain Assessment Pain Assessment: 0-10 Pain Score: 4  Pain  Location: R hip Pain Descriptors / Indicators: Grimacing, Aching Pain Intervention(s): Limited activity within patient's tolerance, Monitored during session, Repositioned    Home Living Family/patient expects to be discharged to:: Private residence Living Arrangements: Alone Available Help at Discharge: Family;Available PRN/intermittently Type of Home: Independent living facility           Home Equipment: Agricultural consultant (2 wheels) Additional Comments: [facility]    Prior Function Prior Level of Function : Independent/Modified Independent             Mobility Comments: uses RW or railings goes to gym 3x a week, denies falls ADLs Comments: Pt reports being IND with basic ADLs     Extremity/Trunk Assessment   Upper Extremity Assessment Upper Extremity Assessment: Overall WFL for tasks assessed    Lower Extremity Assessment Lower Extremity Assessment: Defer to PT evaluation RLE Deficits / Details: minimal active movement of R hip flexion, all others at least 3+/5- not formally assessed LLE Deficits / Details: grossly 3+/5, not formally tested       Communication   Communication Communication: Impaired Factors Affecting Communication: Hearing impaired    Cognition Arousal: Alert Behavior During Therapy: WFL for tasks assessed/performed   PT - Cognitive impairments: History of cognitive impairments                       PT - Cognition Comments: cognition appropriate for functional tasks, at least oriented x3 Following commands: Intact       Cueing Cueing Techniques: Verbal cues, Tactile cues     General Comments      Exercises     Assessment/Plan    PT Assessment Patient needs continued PT services  PT Problem List Decreased strength;Decreased range of motion;Decreased activity tolerance;Decreased mobility;Pain       PT Treatment Interventions DME instruction;Gait training;Functional mobility training;Therapeutic activities;Therapeutic  exercise;Balance training;Neuromuscular re-education;Patient/family education;Cognitive remediation    PT Goals (Current goals can be found in the Care Plan section)  Acute Rehab PT Goals Patient Stated Goal: to get stronger PT Goal Formulation: With patient/family Time For Goal Achievement: 03/03/24 Potential to Achieve Goals: Good    Frequency Min 3X/week     Co-evaluation PT/OT/SLP Co-Evaluation/Treatment: Yes Reason for Co-Treatment: For patient/therapist safety;To address functional/ADL transfers PT goals addressed during session: Mobility/safety with mobility OT goals addressed during session: ADL's and self-care       AM-PAC PT 6 Clicks Mobility  Outcome Measure Help needed turning from your back to your side while in a flat bed without using bedrails?: A Little Help needed moving from lying on your back to sitting on the side of a flat bed without using bedrails?: A Lot Help needed moving to and from a bed to a chair (including a wheelchair)?: A Little Help needed standing up from a chair using your arms (e.g., wheelchair or bedside chair)?: A Little Help needed to walk in hospital room?: A Little Help needed climbing 3-5 steps with a railing? : A Lot 6 Click Score: 16    End of Session Equipment Utilized During Treatment: Gait belt Activity Tolerance: Patient tolerated treatment well Patient left: in bed;with call bell/phone within  reach;with family/visitor present Nurse Communication: Mobility status PT Visit Diagnosis: Other abnormalities of gait and mobility (R26.89);Muscle weakness (generalized) (M62.81);Difficulty in walking, not elsewhere classified (R26.2);Pain Pain - Right/Left: Right Pain - part of body: Hip    Time: 8666-8643 PT Time Calculation (min) (ACUTE ONLY): 23 min   Charges:   PT Evaluation $PT Eval Low Complexity: 1 Low PT Treatments $Therapeutic Activity: 8-22 mins PT General Charges $$ ACUTE PT VISIT: 1 Visit         Janell Axe, SPT

## 2024-02-18 NOTE — Evaluation (Signed)
 Occupational Therapy Evaluation Patient Details Name: Kristin Deleon MRN: [id number] DOB: 06-30-1927 Today's Date: 02/18/2024   History of Present Illness   Pt is a 89 y/o F presenting to ED with c/o R hip pain and weakness. MRI of R hip found R iliopsoas tendon avulsion. 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    Clinical Impressions Kristin Deleon was seen for OT evaluation this date. Prior to hospital admission, pt was MOD I using RW, goes to gym with a trainer 3x/week. Pt lives at [facility]. Pt currently requires MOD A for bed mobility. CGA + RW for ADL t/f and don/doff B socks in sitting. CGA standing functional reaching tasks inside BOS. Pt would benefit from skilled OT to address noted impairments and functional limitations (see below for any additional details). Upon hospital discharge, recommend OT follow up.    If plan is discharge home, recommend the following:   A little help with walking and/or transfers;A little help with bathing/dressing/bathroom;Help with stairs or ramp for entrance     Functional Status Assessment   Patient has had a recent decline in their functional status and demonstrates the ability to make significant improvements in function in a reasonable and predictable amount of time.     Equipment Recommendations   None recommended by OT     Recommendations for Other Services         Precautions/Restrictions   Precautions Precautions: Fall Recall of Precautions/Restrictions: Intact Precaution/Restrictions Comments: Per pt's daughter, pt has a soft collar that she wears when OOB due to failed C1-3 fusion Required Braces or Orthoses: Cervical Brace Cervical Brace: Soft collar Restrictions Weight Bearing Restrictions Per Provider Order: Yes RLE Weight Bearing Per Provider Order: Weight bearing as tolerated     Mobility Bed Mobility Overal bed mobility: Needs Assistance Bed  Mobility: Supine to Sit, Sit to Supine     Supine to sit: Mod assist Sit to supine: Mod assist        Transfers Overall transfer level: Needs assistance Equipment used: Rolling walker (2 wheels) Transfers: Sit to/from Stand Sit to Stand: Contact guard assist                  Balance Overall balance assessment: Needs assistance Sitting-balance support: Feet supported Sitting balance-Leahy Scale: Good     Standing balance support: No upper extremity supported, During functional activity Standing balance-Leahy Scale: Fair                             ADL either performed or assessed with clinical judgement   ADL Overall ADL's : Needs assistance/impaired                                       General ADL Comments: CGA + RW for ADL t/f and don/doff B socks in sitting.     Vision         Perception         Praxis         Pertinent Vitals/Pain Pain Assessment Pain Assessment: 0-10 Pain Score: 4  Pain Location: R hip Pain Descriptors / Indicators: Grimacing, Aching Pain Intervention(s): Limited activity within patient's tolerance, Repositioned     Extremity/Trunk Assessment Upper Extremity Assessment Upper Extremity Assessment: Overall WFL for tasks assessed   Lower Extremity  Assessment Lower Extremity Assessment: Defer to PT evaluation RLE Deficits / Details: minimal active movement of R hip flexion 2/2 pain, all others at least 3+/5- not formally assessed LLE Deficits / Details: grossly 3+/5, not formally tested       Communication Communication Communication: Impaired Factors Affecting Communication: Hearing impaired   Cognition Arousal: Alert Behavior During Therapy: WFL for tasks assessed/performed Cognition: History of cognitive impairments                               Following commands: Intact       Cueing  General Comments   Cueing Techniques: Verbal cues;Tactile cues      Exercises      Shoulder Instructions      Home Living Family/patient expects to be discharged to:: Private residence Living Arrangements: Alone Available Help at Discharge: Family;Available PRN/intermittently Type of Home: Independent living facility                       Home Equipment: Agricultural consultant (2 wheels)   Additional Comments: [facility]      Prior Functioning/Environment Prior Level of Function : Independent/Modified Independent             Mobility Comments: uses RW or railings goes to gym 3x a week, denies falls ADLs Comments: Pt reports being IND with basic ADLs    OT Problem List: Decreased strength;Decreased range of motion;Decreased activity tolerance;Impaired balance (sitting and/or standing)   OT Treatment/Interventions: Self-care/ADL training;Therapeutic exercise;Energy conservation;DME and/or AE instruction;Therapeutic activities;Patient/family education      OT Goals(Current goals can be found in the care plan section)   Acute Rehab OT Goals Patient Stated Goal: to go to rehab OT Goal Formulation: With patient/family Time For Goal Achievement: 03/03/24 Potential to Achieve Goals: Good ADL Goals Pt Will Perform Grooming: with modified independence;standing Pt Will Perform Lower Body Dressing: with modified independence;sit to/from stand Pt Will Transfer to Toilet: with modified independence;ambulating;regular height toilet   OT Frequency:  Min 2X/week    Co-evaluation PT/OT/SLP Co-Evaluation/Treatment: Yes Reason for Co-Treatment: For patient/therapist safety;To address functional/ADL transfers PT goals addressed during session: Mobility/safety with mobility OT goals addressed during session: ADL's and self-care      AM-PAC OT 6 Clicks Daily Activity     Outcome Measure Help from another person eating meals?: None Help from another person taking care of personal grooming?: A Little Help from another person toileting, which includes  using toliet, bedpan, or urinal?: A Little Help from another person bathing (including washing, rinsing, drying)?: A Lot Help from another person to put on and taking off regular upper body clothing?: A Little Help from another person to put on and taking off regular lower body clothing?: A Little 6 Click Score: 18   End of Session Equipment Utilized During Treatment: Rolling walker (2 wheels) Nurse Communication: Mobility status  Activity Tolerance: Patient tolerated treatment well Patient left: in bed;with call bell/phone within reach;with family/visitor present  OT Visit Diagnosis: Other abnormalities of gait and mobility (R26.89);Muscle weakness (generalized) (M62.81)                Time: 8666-8643 OT Time Calculation (min): 23 min Charges:  OT General Charges $OT Visit: 1 Visit OT Evaluation $OT Eval Low Complexity: 1 Low  Elston Slot, M.S. OTR/L  02/18/24, 3:45 PM  ascom [phone]

## 2024-02-19 DIAGNOSIS — R000 Tachycardia, unspecified: Secondary | ICD-10-CM | POA: Diagnosis not present

## 2024-02-19 DIAGNOSIS — G9340 Encephalopathy, unspecified: Secondary | ICD-10-CM

## 2024-02-19 DIAGNOSIS — M25551 Pain in right hip: Secondary | ICD-10-CM | POA: Diagnosis not present

## 2024-02-19 LAB — COMPREHENSIVE METABOLIC PANEL WITH GFR
ALT: 10 U/L (ref 0–44)
AST: 16 U/L (ref 15–41)
Albumin: 3.4 g/dL — ABNORMAL LOW (ref 3.5–5.0)
Alkaline Phosphatase: 55 U/L (ref 38–126)
Anion gap: 9 (ref 5–15)
BUN: 22 mg/dL (ref 8–23)
CO2: 25 mmol/L (ref 22–32)
Calcium: 8.6 mg/dL — ABNORMAL LOW (ref 8.9–10.3)
Chloride: 106 mmol/L (ref 98–111)
Creatinine, Ser: 1 mg/dL (ref 0.44–1.00)
GFR, Estimated: 52 mL/min — ABNORMAL LOW (ref >60)
Glucose, Bld: 192 mg/dL — ABNORMAL HIGH (ref 70–99)
Potassium: 3.7 mmol/L (ref 3.5–5.1)
Sodium: 140 mmol/L (ref 135–145)
Total Bilirubin: 0.6 mg/dL (ref 0.0–1.2)
Total Protein: 6 g/dL — ABNORMAL LOW (ref 6.5–8.1)

## 2024-02-19 LAB — LIPID PANEL
Cholesterol: 116 mg/dL (ref 0–200)
HDL: 42 mg/dL (ref >40)
LDL Cholesterol: 58 mg/dL (ref 0–99)
Total CHOL/HDL Ratio: 2.8 ratio
Triglycerides: 80 mg/dL (ref <150)
VLDL: 16 mg/dL (ref 0–40)

## 2024-02-19 LAB — GLUCOSE, CAPILLARY
Glucose-Capillary: 170 mg/dL — ABNORMAL HIGH (ref 70–99)
Glucose-Capillary: 90 mg/dL (ref 70–99)
Glucose-Capillary: 221 mg/dL — ABNORMAL HIGH (ref 70–99)
Glucose-Capillary: 137 mg/dL — ABNORMAL HIGH (ref 70–99)

## 2024-02-19 LAB — CBC
HCT: 34.1 % — ABNORMAL LOW (ref 36.0–46.0)
Hemoglobin: 11 g/dL — ABNORMAL LOW (ref 12.0–15.0)
MCH: 29.3 pg (ref 26.0–34.0)
MCHC: 32.3 g/dL (ref 30.0–36.0)
MCV: 90.7 fL (ref 80.0–100.0)
Platelets: 189 K/uL (ref 150–400)
RBC: 3.76 MIL/uL — ABNORMAL LOW (ref 3.87–5.11)
RDW: 13.5 % (ref 11.5–15.5)
WBC: 10.1 K/uL (ref 4.0–10.5)
nRBC: 0 % (ref 0.0–0.2)

## 2024-02-19 LAB — D-DIMER, QUANTITATIVE: D-Dimer, Quant: 1.55 ug{FEU}/mL — ABNORMAL HIGH (ref 0.00–0.50)

## 2024-02-19 LAB — MAGNESIUM: Magnesium: 1.5 mg/dL — ABNORMAL LOW (ref 1.7–2.4)

## 2024-02-19 LAB — BRAIN NATRIURETIC PEPTIDE: B Natriuretic Peptide: 118.2 pg/mL — ABNORMAL HIGH (ref 0.0–100.0)

## 2024-02-19 MED ORDER — METFORMIN HCL ER (OSM) 1000 MG PO TB24
1000.0000 mg | ORAL_TABLET | Freq: Every day | ORAL | Status: DC
Start: 2024-02-19 — End: 2024-05-01

## 2024-02-19 MED ORDER — METFORMIN HCL ER 500 MG PO TB24
1000.0000 mg | ORAL_TABLET | Freq: Every day | ORAL | Status: DC
  Administered 2024-02-20: 1000 mg via ORAL
  Filled 2024-02-19: qty 2

## 2024-02-19 MED ORDER — DIGOXIN 0.25 MG/ML IJ SOLN
0.2500 mg | Freq: Once | INTRAMUSCULAR | Status: AC
Start: 2024-02-19 — End: 2024-02-19
  Administered 2024-02-19: 0.25 mg via INTRAVENOUS
  Filled 2024-02-19: qty 2

## 2024-02-19 MED ORDER — LORAZEPAM 2 MG/ML IJ SOLN: 0.5000 mg | Freq: Once | INTRAMUSCULAR | Status: DC

## 2024-02-19 MED ORDER — DIAZEPAM 5 MG/ML IJ SOLN
2.5000 mg | Freq: Once | INTRAMUSCULAR | Status: AC
  Administered 2024-02-19: 2.5 mg via INTRAVENOUS
  Filled 2024-02-19: qty 2

## 2024-02-19 MED ORDER — DILTIAZEM HCL 25 MG/5ML IV SOLN
5.0000 mg | Freq: Once | INTRAVENOUS | Status: AC
  Administered 2024-02-19: 5 mg via INTRAVENOUS
  Filled 2024-02-19: qty 5

## 2024-02-19 MED ORDER — DILTIAZEM HCL 25 MG/5ML IV SOLN
10.0000 mg | Freq: Once | INTRAVENOUS | Status: DC
  Filled 2024-02-19: qty 5

## 2024-02-19 MED ORDER — SODIUM CHLORIDE 0.9 % IV BOLUS
250.0000 mL | Freq: Once | INTRAVENOUS | Status: AC
  Administered 2024-02-19: 250 mL via INTRAVENOUS

## 2024-02-19 NOTE — Progress Notes (Signed)
 Occupational Therapy Treatment Patient Details Name: Kristin Deleon MRN: [id number] DOB: 1927/07/12 Today's Date: 02/19/2024   History of present illness Pt is a 89 y/o F presenting to ED with c/o R hip pain and weakness. MRI of R hip found R iliopsoas tendon avulsion. 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   OT comments  Pt seen for OT treatment on this date. Upon arrival to room pt resting in bed, agreeable to tx. Pt alert and oriented x1, self only. Daughter at pt's bedside. Pt completed bed mobility with MINA, verbal/tactile cues for technique, requires CGA for STS from low bed height with use of  bed rail and RW for support. Pt amb to sink for simulated grooming tasks with CGA for safety, pt retired in Medical illustrator with all needs in reach. Daughter endorses pt is not close to her functional baseline. Pt making good progress toward goals, will continue to follow POC. Discharge recommendation remains appropriate.        If plan is discharge home, recommend the following:  A little help with walking and/or transfers;A little help with bathing/dressing/bathroom;Help with stairs or ramp for entrance   Equipment Recommendations  None recommended by OT    Recommendations for Other Services      Precautions / Restrictions Precautions Precautions: Fall Recall of Precautions/Restrictions: Intact Precaution/Restrictions Comments: Per pt's daughter, pt has a soft collar that she wears when OOB due to failed C1-3 fusion Required Braces or Orthoses: Cervical Brace Cervical Brace: Soft collar Restrictions Weight Bearing Restrictions Per Provider Order: Yes RLE Weight Bearing Per Provider Order: Weight bearing as tolerated       Mobility Bed Mobility Overal bed mobility: Needs Assistance Bed Mobility: Supine to Sit     Supine to sit: Min assist     General bed mobility comments: Cues for sequencing and technique throughout  bed mobility    Transfers Overall transfer level: Needs assistance Equipment used: Rolling walker (2 wheels) Transfers: Sit to/from Stand Sit to Stand: Contact guard assist           General transfer comment: Amb to the sink with RW, MINA for RW management often bumping into furniture, doesnt like to be corrected     Balance Overall balance assessment: Needs assistance Sitting-balance support: Feet supported Sitting balance-Leahy Scale: Good Sitting balance - Comments: good static and dynamic sitting   Standing balance support: No upper extremity supported, During functional activity Standing balance-Leahy Scale: Fair Standing balance comment: no LOB                           ADL either performed or assessed with clinical judgement   ADL Overall ADL's : Needs assistance/impaired     Grooming: Wash/dry face;Wash/dry hands;Oral care;Standing;Contact guard assist                   Toilet Transfer: Contact guard assist;Ambulation;Rolling walker (2 wheels) Toilet Transfer Details (indicate cue type and reason): Simulated toilet t/f         Functional mobility during ADLs: Contact guard assist;Rolling walker (2 wheels) General ADL Comments: Simulated sink level grooming tasks with CGA throughout, steady static standing     Communication Communication Communication: Impaired Factors Affecting Communication: Hearing impaired   Cognition Arousal: Alert Behavior During Therapy: WFL for tasks assessed/performed Cognition: History of cognitive impairments             OT -  Cognition Comments: A/Ox1, self only                 Following commands: Intact        Cueing   Cueing Techniques: Verbal cues, Tactile cues  Exercises Exercises: Other exercises Other Exercises Other Exercises: Edu: Role of OT session, benefits of taking it easy and listening to your body           General Comments Daughter at bedside, pt easily adgiated     Pertinent Vitals/ Pain       Pain Assessment Pain Assessment: 0-10 Pain Score: 2  Pain Location: R hip Pain Descriptors / Indicators: Discomfort Pain Intervention(s): Limited activity within patient's tolerance, Monitored during session, Repositioned                                                          Frequency  Min 2X/week        Progress Toward Goals  OT Goals(current goals can now be found in the care plan section)  Progress towards OT goals: Progressing toward goals  Acute Rehab OT Goals OT Goal Formulation: With patient/family Time For Goal Achievement: 03/03/24 Potential to Achieve Goals: Good ADL Goals Pt Will Perform Grooming: with modified independence;standing Pt Will Perform Lower Body Dressing: with modified independence;sit to/from stand Pt Will Transfer to Toilet: with modified independence;ambulating;regular height toilet   AM-PAC OT 6 Clicks Daily Activity     Outcome Measure   Help from another person eating meals?: None Help from another person taking care of personal grooming?: A Little Help from another person toileting, which includes using toliet, bedpan, or urinal?: A Little Help from another person bathing (including washing, rinsing, drying)?: A Lot Help from another person to put on and taking off regular upper body clothing?: A Little Help from another person to put on and taking off regular lower body clothing?: A Little 6 Click Score: 18    End of Session Equipment Utilized During Treatment: Rolling walker (2 wheels)  OT Visit Diagnosis: Other abnormalities of gait and mobility (R26.89);Muscle weakness (generalized) (M62.81)   Activity Tolerance Patient tolerated treatment well   Patient Left in chair;with call bell/phone within reach;with chair alarm set;with family/visitor present   Nurse Communication Mobility status        Time: 8454-8395 OT Time Calculation (min): 19 min  Charges: OT  General Charges $OT Visit: 1 Visit OT Treatments $Self Care/Home Management : 8-22 mins  Larraine Colas M.S. OTR/L  02/19/24, 4:28 PM

## 2024-02-19 NOTE — TOC Transition Note (Signed)
 Transition of Care [facility]) - Discharge Note   Patient Details  Name: Kristin Deleon MRN: [id number] Date of Birth: 17-Feb-1928  Transition of Care [facility]) CM/SW Contact:  [place] L Hula Tasso, LCSW Phone Number: 02/19/2024, 11:17 AM   Clinical Narrative:      Discharge orders and summary in. Patient is going to [facility] scheduled for transport. Daughter, Sotero, notified of transport.   No further TOC needs identified. TOC signing off.        Patient Goals and CMS Choice            Discharge Placement                       Discharge Plan and Services Additional resources added to the After Visit Summary for                                       Social Drivers of Health (SDOH) Interventions SDOH Screenings   Food Insecurity: No Food Insecurity (02/18/2024)  Housing: Low Risk  (02/18/2024)  Transportation Needs: No Transportation Needs (02/18/2024)  Utilities: Not At Risk (02/18/2024)  Alcohol Screen: Low Risk  (06/27/2023)  Depression (PHQ2-9): Low Risk  (01/03/2024)  Financial Resource Strain: Low Risk  (06/27/2023)  Physical Activity: Insufficiently Active (06/27/2023)  Social Connections: Moderately Isolated (02/18/2024)  Stress: Stress Concern Present (06/27/2023)  Tobacco Use: Low Risk  (01/16/2024)   Received from [facility]     Readmission Risk Interventions     No data to display

## 2024-02-19 NOTE — Progress Notes (Signed)
 Patient assisted to bathroom. On way back to bed became very agitated and aggressive.  At approximately the same time I received a call for CCMD stating the patient was in sustained SVT. Rapid response called.  Patient had pulled out IV during the agitation episode. IV team restarted IV.  ICU RN at bedside  communicating with Dr Lawence.   Orders given.  Valium given with good results in calming patient.

## 2024-02-19 NOTE — Plan of Care (Signed)
   Problem: Coping: Goal: Ability to adjust to condition or change in health will improve Outcome: Progressing   Problem: Fluid Volume: Goal: Ability to maintain a balanced intake and output will improve Outcome: Progressing

## 2024-02-19 NOTE — TOC Initial Note (Signed)
 Transition of Care [facility]) - Initial/Assessment Note    Patient Details  Name: Kristin Deleon MRN: [id number] Date of Birth: [birth year]  [facility]) CM/SW Contact:    [place] L Siddhartha Hoback, LCSW Phone Number: 02/19/2024, 9:54 AM  Clinical Narrative:                  CSW received secure chat from attending. SNF is recommended. CSW spoke with patient daughter, Ms. Jude, to confirm that that the recommendations have been reviewed with her. Ms. Jude advised that she is aware.   CSW notified Alfonso Novak, [place]. CSW advised that patient will be accepted. FL2 completed and sent to facility.        Patient Goals and CMS Choice            Expected Discharge Plan and Services                                              Prior Living Arrangements/Services                       Activities of Daily Living   ADL Screening (condition at time of admission) Independently performs ADLs?: Yes (appropriate for developmental age) Is the patient deaf or have difficulty hearing?: Yes Does the patient have difficulty seeing, even when wearing glasses/contacts?: No Does the patient have difficulty concentrating, remembering, or making decisions?: No  Permission Sought/Granted                  Emotional Assessment              Admission diagnosis:  TIA (transient ischemic attack) [G45.9] Weakness of right lower extremity [R29.898] Patient Active Problem List   Diagnosis Date Noted   TIA (transient ischemic attack) 02/18/2024   No-show for appointment 12/25/2023   Mild late onset Alzheimer's dementia (HCC) 12/21/2022   Other symptoms and signs involving cognitive functions and awareness 01/25/2022   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, subsequent encounter 01/25/2022   Allergic rhinitis 02/17/2020   Hyperlipidemia 02/17/2020   Hypertension 02/17/2020   Meniere's disease 02/17/2020    Postmenopausal 02/17/2020   Thyroid  nodule 02/17/2020   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   Cervical vertebral fusion 12/14/2014   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 [place] [address] Phone: [phone] Fax: [phone]     Social Drivers of Health (SDOH) Social History: SDOH Screenings   Food Insecurity: No Food Insecurity (02/18/2024)  Housing: Low Risk  (02/18/2024)  Transportation Needs: No Transportation Needs (02/18/2024)  Utilities: Not At Risk (02/18/2024)  Alcohol Screen: Low Risk  (06/27/2023)  Depression (PHQ2-9): Low Risk  (01/03/2024)  Financial Resource Strain: Low Risk  (06/27/2023)  Physical Activity: Insufficiently  Active (06/27/2023)  Social Connections: Moderately Isolated (02/18/2024)  Stress: Stress Concern Present (06/27/2023)  Tobacco Use: Low Risk  (01/16/2024)   Received from [facility]   SDOH Interventions:     Readmission Risk Interventions     No data to display

## 2024-02-19 NOTE — Significant Event (Signed)
 Rapid Response Event Note   Reason for Call :  HR 160s-190s  Initial Focused Assessment:  Patient Swinging at staff and verbally abusing staff asking where the other staff went and who owns the building. Pts HT 161 on tele BP 163/106 RR 24 O2 95% on room Air. Patient intermittenly confused all shift. Patient just had recent trip to the bathroom and thinks she is being held Environmental health practitioner. Dr Lawence contacted over the phone regarding this patient. EKG showing Wide QRS complex 136.      Interventions: EKG -2.5 MG Valium  given  -5mg  Cardizem  Given -250 NS bolus  -Labs  Hr Decreased to 90s-110. Patient less anxious BP Stable Mansy aware   0530 Dr lawence Rounded on Patient Digoxin  order PT HR back at 130 0625  HR down to 113 per primary nurse  MD Notified: Dr Lawence Call Time:0400 Arrival Time:0403 End Upfz:9554  Lesley LOISE Shams, RN

## 2024-02-19 NOTE — Care Management Obs Status (Signed)
 MEDICARE OBSERVATION STATUS NOTIFICATION   Patient Details  Name: [name] MRN: [id number] Date of Birth: 1928/01/21   Medicare Observation Status Notification Given:  Chaney BRANDY CHRISTIANE LELON, CMA 02/19/2024, 10:59 AM

## 2024-02-19 NOTE — Discharge Summary (Signed)
 Kristin Deleon FMW:[id number] DOB: [birth year] DOA: 02/18/2024  PCP: Abdul Fine, MD  Admit date: 02/18/2024 Discharge date: 02/20/2024  Time spent: 35 minutes  Recommendations for Outpatient Follow-up:  Pcp f/u Orthopedics (dr. Edie or colleague) 4-6 weeks     Discharge Diagnoses:  Principal Problem:   Hip pain, right Active Problems:   Hypertension   Diabetes mellitus, type 2 (HCC)   Mild late onset Alzheimer's dementia [facility])   Discharge Condition: stable  Diet recommendation: heart healthy  Filed Weights   02/18/24 0219 02/18/24 1735  Weight: 59 kg 58.6 kg    History of present illness:  From admission h and p Kristin Deleon is a 89 y.o. female with medical history significant of multiple OA's with bilateral TKA and chronic ambulation impairment on roller walker, IIDM, COPD, came in with right hip pain and right leg weakness.   Patient started to have right hip pain for the last several days, she also complained about feeling weak of right lower extremities same time and decided to come to ED.  In the ED, ED physician found patient has significant weakness of right lower extremity but minimum right hip pain.  MRI was ordered which showed negative for stroke.  Right hip MRI is pending.  Blood work showed WBC 7.8 hemoglobin 11.0, BUN 26 creatinine 0.9 glucose 217. UA negative for UTI ED tried to walk the patient however patient appears to be too weak to ambulate  Hospital Course:  Patient presents with right hip pain and weakness after injury at home, patient has dementia and does not recall the event. There was initial concern for stroke (though pain is not a stroke symptoms); stroke w/u including MRI was negative. MRI of right hip does show right iliopsoas tendon avulsion. Dr. Edie of ortho reviewed, he advises weight bearing as tolerated and f/u in approximately 1 month. PT evaluated and advises skilled nursing, which will be arranged. Has not required opioid analgesia  here. Other medical problems stable. Will advise reducing metformin  dose given GFR.   Hospital discharge delayed one day as SNF refused to accept given delirium the previous night. On day of discharge patient calm and conversant. She was found down on knees next to bed this morning, no complaints of pain, no apparent injury, ambulating at her current baseline, x-ray of hips negative for fracture.  Procedures: none   Consultations: Orthopedics (curbside)  Discharge Exam: Vitals:   02/20/24 0715 02/20/24 0810  BP: (!) 151/90 126/73  Pulse: 99 78  Resp: 18 18  Temp: 98.3 F (36.8 C) 98.5 F (36.9 C)  SpO2: 98% 96%    General: NAD3 Cardiovascular: RRR Respiratory: CTAB Ext: warm, 4+/5 strength  Discharge Instructions   Discharge Instructions     Diet - low sodium heart healthy   Complete by: As directed    Increase activity slowly   Complete by: As directed       Allergies as of 02/20/2024       Reactions   Amlodipine Swelling   Lower leg edema   Tramadol Nausea Only   Codeine Nausea And Vomiting   Fentanyl  Nausea And Vomiting   Hydromorphone Hcl Nausea And Vomiting   Morphine Itching, Nausea And Vomiting   Other reaction(s): Vomiting/ feels like ants under skin   Nucynta [tapentadol] Other (See Comments)   hallucinations   Statins    Muscle pain    Tegaderm Ag Mesh [silver] Other (See Comments)   blisters   Tetracaine Itching   Tetracyclines &  Related Rash        Medication List     TAKE these medications    Acetaminophen  325 MG Caps Take by mouth.   ARIPiprazole  5 MG tablet Commonly known as: Abilify  Take 1 tablet (5 mg total) by mouth at bedtime.   CENTRUM SILVER 50+WOMEN PO Take by mouth. am   ezetimibe  10 MG tablet Commonly known as: ZETIA  Take 1 tablet (10 mg total) by mouth daily.   glucose blood test strip Use to test blood sugar twice daily. Dx:E11.65   ipratropium 0.03 % nasal spray Commonly known as: ATROVENT  Place 2 sprays  into both nostrils 2 (two) times daily.   latanoprost  0.005 % ophthalmic solution Commonly known as: Xalatan  Place 1 drop into both eyes at bedtime. 1 drop per eye hour of sleep   meclizine  25 MG tablet Commonly known as: ANTIVERT  Take 1 tablet (25 mg total) by mouth 3 (three) times daily as needed for dizziness.   metformin  1000 MG (OSM) 24 hr tablet Commonly known as: FORTAMET  Take 1 tablet (1,000 mg total) by mouth daily with breakfast. What changed: how much to take   triamcinolone cream 0.1 % Commonly known as: KENALOG Apply 1 application topically as needed. Apply externally to the affected area twice daily   vitamin B-12 500 MCG tablet Commonly known as: CYANOCOBALAMIN Take 1 tablet (500 mcg total) by mouth daily. dinner        Allergies  Allergen Reactions   Amlodipine Swelling    Lower leg edema   Tramadol Nausea Only   Codeine Nausea And Vomiting   Fentanyl  Nausea And Vomiting   Hydromorphone Hcl Nausea And Vomiting   Morphine Itching and Nausea And Vomiting    Other reaction(s): Vomiting/ feels like ants under skin   Nucynta [Tapentadol] Other (See Comments)    hallucinations   Statins     Muscle pain    Tegaderm Ag Mesh [Silver] Other (See Comments)    blisters   Tetracaine Itching   Tetracyclines & Related Rash    Contact information for follow-up providers     Abdul Fine, MD Follow up.   Specialty: Family Medicine Contact information: [address] [place] [address] [phone]         Poggi, Norleen PARAS, MD Follow up.   Specialty: Orthopedic Surgery Why: 4-6 weeks Contact information: [address] [facility] [place] [address] [phone]              Contact information for after-discharge care     Destination     [facility] .   Service: Skilled Nursing Contact information: [address] [place]  [phone]                      The results of  significant diagnostics from this hospitalization (including imaging, microbiology, ancillary and laboratory) are listed below for reference.    Significant Diagnostic Studies: DG Pelvis Portable Result Date: 02/20/2024 CLINICAL DATA:  Unwitnessed fall. EXAM: PORTABLE PELVIS 1-2 VIEWS COMPARISON:  None Available. FINDINGS: Status post bilateral total hip arthroplasties. No fracture or dislocation is noted. Vascular calcifications are noted. IMPRESSION: No acute abnormality seen. Electronically Signed   By: Lynwood Landy Raddle M.D.   On: 02/20/2024 10:31   ECHOCARDIOGRAM COMPLETE Result Date: 02/18/2024    ECHOCARDIOGRAM REPORT   Patient Name:   JILLYAN PLITT Date of Exam: 02/18/2024 Medical Rec #:  [id number]  Height:       62.0 in Accession #:    [id number]       Weight:       130.0 lb Date of Birth:  12/25/1927        BSA:          1.592 m Patient Age:    89 years         BP:           144/81 mmHg Patient Gender: F                HR:           76 bpm. Exam Location:  [facility] Procedure: 2D Echo, Color Doppler and Cardiac Doppler (Both Spectral and Color            Flow Doppler were utilized during procedure). Indications:     TIA G45.9  History:         Patient has no prior history of Echocardiogram examinations.                  TIA; Risk Factors:Hypertension, Diabetes and Dyslipidemia.  Sonographer:     Thea Norlander RCS Referring Phys:  [id number] CORT ONEIDA MANA Diagnosing Phys: Lonni Hanson MD  Sonographer Comments: Technically difficult study due to poor echo windows, suboptimal apical window, suboptimal parasternal window and Technically challenging study due to limited acoustic windows. Image acquisition challenging due to patient body habitus. IMPRESSIONS  1. Left ventricular ejection fraction, by estimation, is >55%. The left ventricle has normal function. Left ventricular endocardial border not optimally defined to evaluate regional wall motion. There is mild left ventricular hypertrophy. Left  ventricular diastolic parameters are consistent with Grade I diastolic dysfunction (impaired relaxation).  2. Right ventricular systolic function is moderately reduced. The right ventricular size is normal. Mildly increased right ventricular wall thickness. There is normal pulmonary artery systolic pressure.  3. The mitral valve was not well visualized. No evidence of mitral valve regurgitation. No evidence of mitral stenosis.  4. The aortic valve is tricuspid. There is mild calcification of the aortic valve. There is mild thickening of the aortic valve. Aortic valve regurgitation is not visualized. Aortic valve sclerosis/calcification is present, without any evidence of aortic stenosis.  5. There is mild dilatation of the ascending aorta, measuring 40 mm.  6. The inferior vena cava is normal in size with <50% respiratory variability, suggesting right atrial pressure of 8 mmHg. FINDINGS  Left Ventricle: Left ventricular ejection fraction, by estimation, is >55%. The left ventricle has normal function. Left ventricular endocardial border not optimally defined to evaluate regional wall motion. The left ventricular internal cavity size was  normal in size. There is mild left ventricular hypertrophy. Left ventricular diastolic parameters are consistent with Grade I diastolic dysfunction (impaired relaxation). Right Ventricle: The right ventricular size is normal. Mildly increased right ventricular wall thickness. Right ventricular systolic function is moderately reduced. There is normal pulmonary artery systolic pressure. The tricuspid regurgitant velocity is  2.48 m/s, and with an assumed right atrial pressure of 8 mmHg, the estimated right ventricular systolic pressure is 32.6 mmHg. Left Atrium: Left atrial size was not well visualized. Right Atrium: Right atrial size was not well visualized. Pericardium: There is no evidence of pericardial effusion. Mitral Valve: The mitral valve was not well visualized. No evidence  of mitral valve regurgitation. No evidence of mitral valve stenosis. Tricuspid Valve: The tricuspid valve is not well visualized. Tricuspid valve regurgitation is mild. Aortic Valve: The  aortic valve is tricuspid. There is mild calcification of the aortic valve. There is mild thickening of the aortic valve. Aortic valve regurgitation is not visualized. Aortic valve sclerosis/calcification is present, without any evidence of aortic stenosis. Aortic valve peak gradient measures 8.4 mmHg. Pulmonic Valve: The pulmonic valve was not well visualized. Pulmonic valve regurgitation is trivial. No evidence of pulmonic stenosis. Aorta: The aortic root is normal in size and structure. There is mild dilatation of the ascending aorta, measuring 40 mm. Pulmonary Artery: The pulmonary artery is not well seen. Venous: The inferior vena cava is normal in size with less than 50% respiratory variability, suggesting right atrial pressure of 8 mmHg. IAS/Shunts: No atrial level shunt detected by color flow Doppler.  LEFT VENTRICLE PLAX 2D LVIDd:         2.80 cm   Diastology LVIDs:         2.10 cm   LV e' medial:    5.55 cm/s LV PW:         1.10 cm   LV E/e' medial:  11.3 LV IVS:        1.20 cm   LV e' lateral:   6.42 cm/s LVOT diam:     2.20 cm   LV E/e' lateral: 9.8 LV SV:         52 LV SV Index:   32 LVOT Area:     3.80 cm  RIGHT VENTRICLE            IVC RV S prime:     6.20 cm/s  IVC diam: 1.70 cm LEFT ATRIUM         Index LA diam:    2.20 cm 1.38 cm/m  AORTIC VALVE AV Area (Vmax): 1.96 cm AV Vmax:        145.00 cm/s AV Peak Grad:   8.4 mmHg LVOT Vmax:      74.90 cm/s LVOT Vmean:     49.800 cm/s LVOT VTI:       0.136 m  AORTA Ao Root diam: 3.10 cm Ao Asc diam:  4.00 cm MITRAL VALVE                TRICUSPID VALVE MV Area (PHT): 2.48 cm     TR Peak grad:   24.6 mmHg MV Decel Time: 306 msec     TR Vmax:        248.00 cm/s MR Peak grad: 12.0 mmHg MR Vmax:      173.00 cm/s   SHUNTS MV E velocity: 62.90 cm/s   Systemic VTI:  0.14 m MV A  velocity: 106.00 cm/s  Systemic Diam: 2.20 cm MV E/A ratio:  0.59 Lonni End MD Electronically signed by Lonni Hanson MD Signature Date/Time: 02/18/2024/6:27:03 PM    Final    US  Carotid Bilateral Result Date: 02/18/2024 CLINICAL DATA:  Transient ischemic attack EXAM: BILATERAL CAROTID DUPLEX ULTRASOUND TECHNIQUE: Elnor scale imaging, color Doppler and duplex ultrasound were performed of bilateral carotid and vertebral arteries in the neck. COMPARISON:  None Available. FINDINGS: Criteria: Quantification of carotid stenosis is based on velocity parameters that correlate the residual internal carotid diameter with NASCET-based stenosis levels, using the diameter of the distal internal carotid lumen as the denominator for stenosis measurement. The following velocity measurements were obtained: RIGHT ICA: 39/11 cm/sec CCA: 53/11 cm/sec SYSTOLIC ICA/CCA RATIO:  0.7 ECA:  26 cm/sec LEFT ICA: 71 over 7 cm/sec CCA: 65/17 cm/sec SYSTOLIC ICA/CCA RATIO:  1.1 ECA:  35 cm/sec RIGHT CAROTID ARTERY: Trace  mild heterogeneous atherosclerotic plaque without evidence of stenosis. RIGHT VERTEBRAL ARTERY:  Patent with normal antegrade flow. LEFT CAROTID ARTERY: Trace heterogeneous atherosclerotic plaque without evidence of stenosis. LEFT VERTEBRAL ARTERY:  Patent with normal antegrade flow. IMPRESSION: 1. Trace atherosclerotic plaque in the bilateral internal carotid arteries without evidence of associated stenosis. 2. Vertebral arteries are patent with normal antegrade flow. Electronically Signed   By: Wilkie Lent M.D.   On: 02/18/2024 11:46   MR HIP RIGHT WO CONTRAST Result Date: 02/18/2024 CLINICAL DATA:  Right hip pain and weakness after falling. Clinical concern or fracture. EXAM: MR OF THE RIGHT HIP WITHOUT CONTRAST TECHNIQUE: Multiplanar, multisequence MR imaging was performed. No intravenous contrast was administered. COMPARISON:  Radiographs 02/18/2024 FINDINGS: Bones: Patient is status post bilateral total hip  arthroplasty. There is associated moderate susceptibility artifact. Allowing for this artifact, no evidence of acute fracture or dislocation. The visualized bony pelvis appears normal. The visualized sacroiliac joints and symphysis pubis appear normal. Joint or bursal effusion Joint effusion: No significant hip joint effusion. Bursae: There is prominent soft tissue edema surrounding the proximal right femur, further described below. No focal periarticular fluid collection identified. Muscles and tendons Muscles and tendons: There is a large amount of edema within and surrounding the right iliopsoas muscle and tendon. The tendon appears completely avulsed from the lesser trochanter with surrounding ill-defined fluid. There is lesser edema within the proximal right thigh adductor musculature. The left iliopsoas, bilateral common hamstring and gluteus tendons appear intact. There is mild gluteus tendinosis bilaterally. Other findings Miscellaneous: Prominent subcutaneous fat within the left groin, measuring up to 2.9 cm on coronal image 32/21, favored to reflect a lipoma over a fat containing hernia. 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 IMPRESSION: 1. Complete avulsion of the right iliopsoas tendon from the lesser trochanter with surrounding soft tissue edema. 2. No evidence of acute fracture or dislocation. 3. Status post bilateral total hip arthroplasty. 4. Prominent subcutaneous fat within the left groin, favored to reflect a lipoma over a fat containing hernia. Correlate clinically. Electronically Signed   By: Elsie Perone M.D.   On: 02/18/2024 08:44   MR BRAIN WO CONTRAST Result Date: 02/18/2024 CLINICAL DATA:  89 year old female with altered mental status. Weakness. Dizziness. EXAM: MRI HEAD WITHOUT CONTRAST TECHNIQUE: Multiplanar, multiecho pulse sequences of the brain and surrounding structures were obtained without intravenous contrast. COMPARISON:  Head CT 01/18/2023 and earlier. Cervical  spine CT 06/18/2022. FINDINGS: Brain: No restricted diffusion to suggest acute infarction. No midline shift, mass effect, evidence of mass lesion, ventriculomegaly, extra-axial collection or acute intracranial hemorrhage. 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 Stable cerebral volume since last year. Metal hardware susceptibility artifact at the craniocervical junction. Patchy and confluent bilateral cerebral white matter T2 and FLAIR hyperintensity. Scattered small chronic infarcts in the bilateral cerebellum. Comparatively mild T2 heterogeneity in the pons. Mild T2 and FLAIR heterogeneity in the deep gray nuclei. No cortical encephalomalacia identified. No definite chronic cerebral blood products on SWI. Vascular: Major intracranial vascular flow voids are preserved. Some generalized intracranial artery tortuosity, and dominant appearing distal left vertebral artery. Skull and upper cervical spine: Chronically abnormal craniocervical junction with posterior fusion hardware, left suboccipital bone screw contacting only a small portion of the left C1 ring as demonstrated by CT last year. But there has been evidence of multilevel upper cervical arthrodesis. Underlying chronic displaced odontoid fracture. And subsequent multifactorial stenosis at the cervicomedullary junction (series 7, image 12). No lower brainstem encephalomalacia is evident. And grossly maintained signal and volume  of the visible cervical spinal cord. Normal background bone marrow signal. Sinuses/Orbits: Postoperative changes to the globes. Mild to moderate left maxillary sinus mucosal thickening. Other paranasal sinuses and mastoids are well aerated. Other: Grossly normal visible internal auditory structures. Chronic postoperative changes to the suboccipital region, posterior neck. IMPRESSION: 1. No acute intracranial abnormality. 2. Chronic small vessel disease, most pronounced in the bilateral cerebellum and cerebral white matter. 3. Advanced chronic posttraumatic  and postoperative deformity at the craniocervical junction, subsequent chronic cervicomedullary junction stenosis. Electronically Signed   By: VEAR Hurst M.D.   On: 02/18/2024 07:25   DG Hip Unilat W or Wo Pelvis 2-3 Views Right Result Date: 02/18/2024 EXAM: 2 or 3 VIEW(S) XRAY OF THE PELVIS AND RIGHT HIP 02/18/2024 05:50:43 AM COMPARISON: None available. CLINICAL HISTORY: BIB [facility] from [facility] with CC of R hip pain. Upon assessing pt, answers are not consistent. Pt is able to make conversation flow but content is often not relevant to CC. At this time pt is oriented to person and place only. FINDINGS: JOINTS: The patient is status post bilateral total hip arthroplasty. No signs of periprosthetic fracture or dislocation. SOFT TISSUES: Moderate distention of the bowel loops with gas and stool. Lateral extension of the left lower quadrant bowel loops beyond the margins of the left iliac bone may reflect underlying hernia. VASCULATURE: Vascular calcifications noted. DISCS/DEGENERATIVE CHANGES: Degenerative changes noted within the imaged portions of the lumbar spine. IMPRESSION: 1. No acute abnormality of the right hip. 2. Status post bilateral total hip arthroplasty without signs of periprosthetic fracture or dislocation. 3. Moderate distention of the bowel loops with gas and stool. Lateral extension of the left lower quadrant bowel loops beyond the margins of the left iliac bone may reflect underlying hernia. Electronically signed by: Waddell Calk MD 02/18/2024 06:18 AM EDT RP Workstation: [id number]    Microbiology: Recent Results (from the past 240 hours)  Resp panel by RT-PCR (RSV, Flu A&B, Covid) Anterior Nasal Swab     Status: None   Collection Time: 02/18/24  2:37 AM   Specimen: Anterior Nasal Swab  Result Value Ref Range Status   SARS Coronavirus 2 by RT PCR NEGATIVE NEGATIVE Final    Comment: (NOTE) SARS-CoV-2 target nucleic acids are NOT DETECTED.  The SARS-CoV-2 RNA is  generally detectable in upper respiratory specimens during the acute phase of infection. The lowest concentration of SARS-CoV-2 viral copies this assay can detect is 138 copies/mL. A negative result does not preclude SARS-Cov-2 infection and should not be used as the sole basis for treatment or other patient management decisions. A negative result may occur with  improper specimen collection/handling, submission of specimen other than nasopharyngeal swab, presence of viral mutation(s) within the areas targeted by this assay, and inadequate number of viral copies(<138 copies/mL). A negative result must be combined with clinical observations, patient history, and epidemiological information. The expected result is Negative.  Fact Sheet for Patients:  BloggerCourse.com  Fact Sheet for Healthcare Providers:  SeriousBroker.it  This test is no t yet approved or cleared by the [place]  FDA and  has been authorized for detection and/or diagnosis of SARS-CoV-2 by FDA under an Emergency Use Authorization (EUA). This EUA will remain  in effect (meaning this test can be used) for the duration of the COVID-19 declaration under Section 564(b)(1) of the Act, 21 U.S.C.section 360bbb-3(b)(1), unless the authorization is terminated  or revoked sooner.       Influenza A by PCR NEGATIVE NEGATIVE Final  Influenza B by PCR NEGATIVE NEGATIVE Final    Comment: (NOTE) The Xpert Xpress SARS-CoV-2/FLU/RSV plus assay is intended as an aid in the diagnosis of influenza from Nasopharyngeal swab specimens and should not be used as a sole basis for treatment. Nasal washings and aspirates are unacceptable for Xpert Xpress SARS-CoV-2/FLU/RSV testing.  Fact Sheet for Patients: BloggerCourse.com  Fact Sheet for Healthcare Providers: SeriousBroker.it  This test is not yet approved or cleared by the [place] FDA and has been authorized for detection and/or diagnosis of SARS-CoV-2 by FDA under an Emergency Use Authorization (EUA). This EUA will remain in effect (meaning this test can be used) for the duration of the COVID-19 declaration under Section 564(b)(1) of the Act, 21 U.S.C. section 360bbb-3(b)(1), unless the authorization is terminated or revoked.     Resp Syncytial Virus by PCR NEGATIVE NEGATIVE Final    Comment: (NOTE) Fact Sheet for Patients: BloggerCourse.com  Fact Sheet for Healthcare Providers: SeriousBroker.it  This test is not yet approved or cleared by the [place]  FDA and has been authorized for detection and/or diagnosis of SARS-CoV-2 by FDA under an Emergency Use Authorization (EUA). This EUA will remain in effect (meaning this test can be used) for the duration of the COVID-19 declaration under Section 564(b)(1) of the Act, 21 U.S.C. section 360bbb-3(b)(1), unless the authorization is terminated or revoked.  Performed at [facility], [address] [place], [place] [address]      Labs: Basic Metabolic Panel: Recent Labs  Lab 02/18/24 0237 02/19/24 0514  NA 140 140  K 4.4 3.7  CL 105 106  CO2 26 25  GLUCOSE 217* 192*  BUN 26* 22  CREATININE 0.92 1.00  CALCIUM 8.7* 8.6*  MG  --  1.5*   Liver Function Tests: Recent Labs  Lab 02/18/24 0237 02/19/24 0514  AST 23 16  ALT 8 10  ALKPHOS 50 55  BILITOT 1.4* 0.6  PROT 6.2* 6.0*  ALBUMIN 3.5 3.4*   No results for input(s): LIPASE, AMYLASE in the last 168 hours. No results for input(s): AMMONIA in the last 168 hours. CBC: Recent Labs  Lab 02/18/24 0237 02/19/24 0514  WBC 7.8 10.1  NEUTROABS 4.5  --   HGB 11.0* 11.0*  HCT 34.4* 34.1*  MCV 91.7 90.7  PLT 193 189   Cardiac Enzymes: Recent Labs  Lab 02/18/24 0237  CKTOTAL 65   BNP: BNP (last 3 results) Recent Labs    02/19/24 0514  BNP 118.2*    ProBNP  (last 3 results) No results for input(s): PROBNP in the last 8760 hours.  CBG: Recent Labs  Lab 02/19/24 1134 02/19/24 1641 02/19/24 1942 02/20/24 0809 02/20/24 1146  GLUCAP 90 221* 137* 119* 119*       Signed:  Devaughn KATHEE Ban MD.  [facility] 02/20/2024, 12:58 PM

## 2024-02-19 NOTE — Progress Notes (Signed)
 Critical care note:  Date of note: 02/19/2024  Subjective: The patient went up to bathroom and when she came back she was significant tachycardic with heart rate 161 and BP was 163 was 106 with respiratory rate of 24 and pulse oximetry of 95% on room air.  She has been intermittently confused all shift.  She has been fairly agitated as well.  Stat EKG showed wide-complex tachycardia with a rate of 136.  The patient did not have any chest pain or cough or wheezing or nausea or vomiting or abdominal pain.  No fever or chills.  Temperature was 98.6 earlier  Objective: Physical examination: Generally: Acutely ill elderly Caucasian female in no acute respiratory distress. Vital signs as above. Head - atraumatic, normocephalic.  Pupils - equal, round and reactive to light and accommodation. Extraocular movements are intact. No scleral icterus.  Oropharynx - moist mucous membranes and tongue. No pharyngeal erythema or exudate.  Neck - supple. No JVD. Carotid pulses 2+ bilaterally. No carotid bruits. No palpable thyromegaly or lymphadenopathy. Cardiovascular - regular rate and tachycardic rhythm. Normal S1 and S2. No murmurs, gallops or rubs.  Lungs - clear to auscultation bilaterally.  Abdomen - soft and nontender. Positive bowel sounds. No palpable organomegaly or masses.  Extremities - no pitting edema, clubbing or cyanosis.  Neuro - grossly non-focal. Skin - no rashes. Breast, pelvic and rectal - deferred.  Labs and notes were reviewed.  Assessment/plan: 1.  Wide-complex tachycardia with associated altered mental status with confusion and agitation. - The patient was given 2.5 mg of IV Valium  with slight improvement of heart rate and later 5 mg of IV Cardizem  for recurrent tachycardia and bolus of IV normal saline at 250 mL and later on digoxin  0.25 mg IV.   - The patient's heart rate came down to 104 and she was fairly comfortable sleeping. - BNP and D-dimer were ordered.  CBC and CMP were  ordered. - Will closely monitor her.  Authorized and performed by: Madison Peaches, MD Total critical care time:   35     minutes. Due to a high probability of clinically significant, life-threatening deterioration, the patient required my highest level of preparedness to intervene emergently and I personally spent this critical care time directly and personally managing the patient.  This critical care time included obtaining a history, examining the patient, pulse oximetry, ordering and review of studies, arranging urgent treatment with development of management plan, evaluation of patient's response to treatment, frequent reassessment, and discussions with other providers. This critical care time was performed to assess and manage the high probability of imminent, life-threatening deterioration that could result in multiorgan failure.  It was exclusive of separately billable procedures and treating other patients and teaching time.

## 2024-02-19 NOTE — Plan of Care (Signed)
  Problem: Education: Goal: Ability to describe self-care measures that may prevent or decrease complications (Diabetes Survival Skills Education) will improve Outcome: Progressing   Problem: Coping: Goal: Ability to adjust to condition or change in health will improve 02/19/2024 0641 by Harless Arland POUR, RN Outcome: Progressing 02/19/2024 0637 by Harless Arland POUR, RN Outcome: Progressing   Problem: Fluid Volume: Goal: Ability to maintain a balanced intake and output will improve Outcome: Progressing   Problem: Skin Integrity: Goal: Risk for impaired skin integrity will decrease Outcome: Progressing

## 2024-02-19 NOTE — NC FL2 (Signed)
 [place]  MEDICAID FL2 LEVEL OF CARE FORM     IDENTIFICATION  Patient Name: Kristin Deleon Birthdate: Dec 22, 1927 Sex: female Admission Date (Current Location): 02/18/2024  [facility] and [place] Number:  Chiropodist and Address:  [facility], [address], [place], [place] [address]      Provider Number: [id number]  Attending Physician Name and Address:  Kandis Devaughn Sayres, MD  Relative Name and Phone Number:  Sotero [place] [phone]    Current Level of Care: Hospital Recommended Level of Care: Skilled Nursing Facility Prior Approval Number:    Date Approved/Denied: 12/02/06 PASRR Number: [id number]  Discharge Plan: SNF    Current Diagnoses: Patient Active Problem List   Diagnosis Date Noted   TIA (transient ischemic attack) 02/18/2024   No-show for appointment 12/25/2023   Mild late onset Alzheimer's dementia (HCC) 12/21/2022   Other symptoms and signs involving cognitive functions and awareness 01/25/2022   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, subsequent encounter 01/25/2022   Allergic rhinitis 02/17/2020   Hyperlipidemia 02/17/2020   Hypertension 02/17/2020   Meniere's disease 02/17/2020   Postmenopausal 02/17/2020   Thyroid  nodule 02/17/2020   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   Cervical vertebral fusion 12/14/2014   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   Bilateral hip pain 06/23/2014   Bilateral shoulder pain 06/23/2014   Chronic back pain 06/23/2014   Acute postoperative pain 12/16/2013   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   History of total hip arthroplasty 01/26/2013   Presence of artificial knee joint 01/26/2013   Loose orthopedic implant (HCC) 01/26/2013   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   Diabetes mellitus, type 2 (HCC) 04/11/2012    Orientation RESPIRATION BLADDER  Height & Weight     Self, Time, Situation  Normal Continent Weight: 129 lb 3 oz (58.6 kg) Height:  5' 2 (157.5 cm)  BEHAVIORAL SYMPTOMS/MOOD NEUROLOGICAL BOWEL NUTRITION STATUS  Other (Comment) (Patient recently agitated and aggressive.)   Continent Diet (Diet Heart Fluid consistency: Thin: Cardiac starting at 09/09 0840  Fall precautions starting at 09/09 0839)  AMBULATORY STATUS COMMUNICATION OF NEEDS Skin   Extensive Assist Verbally Normal                       Personal Care Assistance Level of Assistance  Bathing, Feeding, Dressing Bathing Assistance: Limited assistance Feeding assistance: Limited assistance Dressing Assistance: Limited assistance     Functional Limitations Info  Sight, Hearing, Speech          SPECIAL CARE FACTORS FREQUENCY  PT (By licensed PT), OT (By licensed OT)     PT Frequency: 3 OT Frequency: 3            Contractures Contractures Info: Not present    Additional Factors Info  Code Status, Allergies Code Status Info: DNR-Limited Allergies Info: Amlodipine High  Swelling Lower leg edema  Tramadol High  Nausea Only   Codeine Not Specified  Nausea And Vomiting   Fentanyl  Not Specified  Nausea And Vomiting   Hydromorphone Hcl Not Specified  Nausea And Vomiting   Morphine Not Specified  Itching, Nausea And Vomiting Other reaction(s): Vomiting/ feels like ants under skin  Nucynta (tapentadol) Not Specified Allergy Other (See Comments) hallucinations  Statins Not Specified   Muscle pain  Tegaderm Ag Mesh (silver) Not Specified Allergy Other (See Comments) blisters  Tetracaine Not Specified  Itching  Tetracyclines & Related           Current Medications (02/19/2024):  This is the current hospital active medication list Current Facility-Administered Medications  Medication Dose Route Frequency Provider Last Rate Last Admin   acetaminophen  (TYLENOL ) tablet 650 mg  650 mg Oral Q4H PRN Laurita Manor T, MD   650 mg at 02/18/24 1411   Or    acetaminophen  (TYLENOL ) 160 MG/5ML solution 650 mg  650 mg Per Tube Q4H PRN Laurita Manor T, MD       Or   acetaminophen  (TYLENOL ) suppository 650 mg  650 mg Rectal Q4H PRN Laurita Manor T, MD       acetaminophen  (TYLENOL ) tablet 325 mg  325 mg Oral Daily Laurita Manor T, MD   325 mg at 02/19/24 9196   ARIPiprazole  (ABILIFY ) tablet 5 mg  5 mg Oral Daily Laurita Manor T, MD   5 mg at 02/19/24 9196   diltiazem  (CARDIZEM ) injection 10 mg  10 mg Intravenous Once Mansy, Jan A, MD       enoxaparin  (LOVENOX ) injection 30 mg  30 mg Subcutaneous Q24H Laurita Manor T, MD   30 mg at 02/19/24 9195   ezetimibe  (ZETIA ) tablet 10 mg  10 mg Oral Daily Laurita Manor T, MD   10 mg at 02/19/24 9196   insulin  aspart (novoLOG ) injection 0-9 Units  0-9 Units Subcutaneous TID WC Laurita Manor T, MD   2 Units at 02/19/24 0804   ipratropium (ATROVENT ) 0.03 % nasal spray 2 spray  2 spray Each Nare BID PRN Laurita Manor T, MD       latanoprost  (XALATAN ) 0.005 % ophthalmic solution 1 drop  1 drop Both Eyes QHS Laurita Manor T, MD       lisinopril  (ZESTRIL ) tablet 20 mg  20 mg Oral Daily Zhang, Ping T, MD   20 mg at 02/19/24 9196   meclizine  (ANTIVERT ) tablet 25 mg  25 mg Oral TID PRN Laurita Manor DASEN, MD       metFORMIN  (GLUCOPHAGE -XR) 24 hr tablet 2,000 mg  2,000 mg Oral Q breakfast Laurita Manor T, MD   2,000 mg at 02/19/24 9196   senna-docusate (Senokot-S) tablet 1 tablet  1 tablet Oral QHS PRN Laurita Manor DASEN, MD         Discharge Medications: Please see discharge summary for a list of discharge medications.  Relevant Imaging Results:  Relevant Lab Results:   Additional Information [id number]  [place] L Kimba Lottes, LCSW

## 2024-02-19 NOTE — TOC Progression Note (Signed)
 Transition of Care [facility]) - Progression Note    Patient Details  Name: VICCI REDER MRN: [id number] Date of Birth: [birth year]  Transition of [facility] CM/SW Contact  [place] L Nola [place], [place] Phone Number: 02/19/2024, 1:50 PM  Clinical Narrative:     CSW received call from [facility]. Concerns noted for patient behaviors. Attending  advised that she is not agitated. Patient not at baseline per Alfonso Novak.   Attending notified. [facility] cancelled. [place] wants 24 hours without medication to control behaviors. Patient has to be 24 hours without agitation or aggression.                     Expected Discharge Plan and Services         Expected Discharge Date: 02/19/24                                     Social Drivers of Health (SDOH) Interventions SDOH Screenings   Food Insecurity: No Food Insecurity (02/18/2024)  Housing: Low Risk  (02/18/2024)  Transportation Needs: No Transportation Needs (02/18/2024)  Utilities: Not At Risk (02/18/2024)  Alcohol Screen: Low Risk  (06/27/2023)  Depression (PHQ2-9): Low Risk  (01/03/2024)  Financial Resource Strain: Low Risk  (06/27/2023)  Physical Activity: Insufficiently Active (06/27/2023)  Social Connections: Moderately Isolated (02/18/2024)  Stress: Stress Concern Present (06/27/2023)  Tobacco Use: Low Risk  (01/16/2024)   Received from [facility]    Readmission Risk Interventions     No data to display

## 2024-02-19 NOTE — Progress Notes (Signed)
 PROGRESS NOTE    Kristin Deleon  FMW:[id number] DOB: Mar 04, 1928 DOA: 02/18/2024 PCP: Abdul Fine, MD     Brief Narrative:   Kristin Deleon is a 89 y.o. female with medical history significant of multiple OA's with bilateral TKA and chronic ambulation impairment on roller walker, IIDM, COPD, came in with right hip pain and right leg weakness.   Patient started to have right hip pain for the last several days, she also complained about feeling weak of right lower extremities same time and decided to come to ED.  In the ED, ED physician found patient has significant weakness of right lower extremity but minimum right hip pain.  MRI was ordered which showed negative for stroke.  Right hip MRI is pending.  Blood work showed WBC 7.8 hemoglobin 11.0, BUN 26 creatinine 0.9 glucose 217. UA negative for UTI ED tried to walk the patient however patient appears to be too weak to ambulate   Assessment & Plan:   Principal Problem:   Hip pain, right Active Problems:   Hypertension   Diabetes mellitus, type 2 (HCC)   Mild late onset Alzheimer's dementia (HCC)   # Right hip pain # iliopsoas tendon avulsion Wbat per ortho, no surgery indicated, f/u 6 weeks. Initial stroke concern but symptoms not consistent and stroke eval is negative - PT advising snf  # Hospital delirium  Last night, resolved - SNF refuses to accept for now, wants us  to monitor  # Alzheimer's dementia With agitation overnight, resolved - cont home abilify  at bedtime - delirium precautions  # Tachycardia Overnight in setting of agigation, EKG stable from priors, resolved - monitor  # T2DM Euglycemic - will reduce home metformin  at d/c given gfr   DVT prophylaxis: lovenox  Code Status: dnr/dni Family Communication: daughter at bedside  Level of care: Telemetry Medical Status is: Observation    Consultants:  none  Procedures: none  Antimicrobials:  none    Subjective: Reports mild right hip  pain  Objective: Vitals:   02/19/24 0502 02/19/24 0503 02/19/24 0631 02/19/24 0730  BP: 119/74 119/74 125/84 (!) 143/89  Pulse: (!) 118 (!) 113 (!) 127 (!) 110  Resp:   20 20  Temp:    98 F (36.7 C)  TempSrc:    Oral  SpO2: 94% 92% 94% 98%  Weight:      Height:        Intake/Output Summary (Last 24 hours) at 02/19/2024 1351 Last data filed at 02/19/2024 0900 Gross per 24 hour  Intake 120 ml  Output 100 ml  Net 20 ml   Filed Weights   02/18/24 0219 02/18/24 1735  Weight: 59 kg 58.6 kg    Examination:  General exam: Appears calm and comfortable  Respiratory system: Clear to auscultation. Respiratory effort normal. Cardiovascular system: S1 & S2 heard, RRR.  Gastrointestinal system: Abdomen is nondistended, soft and nontender. N  Central nervous system: Alert and oriented to self, non-focal, 5/5 lower strength Extremities: Symmetric 5 x 5 power. Skin: No rashes, lesions or ulcers Psychiatry: pleasantly confused    Data Reviewed: I have personally reviewed following labs and imaging studies  CBC: Recent Labs  Lab 02/18/24 0237 02/19/24 0514  WBC 7.8 10.1  NEUTROABS 4.5  --   HGB 11.0* 11.0*  HCT 34.4* 34.1*  MCV 91.7 90.7  PLT 193 189   Basic Metabolic Panel: Recent Labs  Lab 02/18/24 0237 02/19/24 0514  NA 140 140  K 4.4 3.7  CL 105 106  CO2 26 25  GLUCOSE 217* 192*  BUN 26* 22  CREATININE 0.92 1.00  CALCIUM 8.7* 8.6*  MG  --  1.5*   GFR: Estimated Creatinine Clearance: 27.2 mL/min (by C-G formula based on SCr of 1 mg/dL). Liver Function Tests: Recent Labs  Lab 02/18/24 0237 02/19/24 0514  AST 23 16  ALT 8 10  ALKPHOS 50 55  BILITOT 1.4* 0.6  PROT 6.2* 6.0*  ALBUMIN 3.5 3.4*   No results for input(s): LIPASE, AMYLASE in the last 168 hours. No results for input(s): AMMONIA in the last 168 hours. Coagulation Profile: No results for input(s): INR, PROTIME in the last 168 hours. Cardiac Enzymes: Recent Labs  Lab  02/18/24 0237  CKTOTAL 65   BNP (last 3 results) No results for input(s): PROBNP in the last 8760 hours. HbA1C: No results for input(s): HGBA1C in the last 72 hours. CBG: Recent Labs  Lab 02/18/24 1208 02/18/24 1630 02/18/24 2121 02/19/24 0754 02/19/24 1134  GLUCAP 173* 87 135* 170* 90   Lipid Profile: Recent Labs    02/19/24 0514  CHOL 116  HDL 42  LDLCALC 58  TRIG 80  CHOLHDL 2.8   Thyroid  Function Tests: Recent Labs    02/18/24 0237  TSH 2.848   Anemia Panel: No results for input(s): VITAMINB12, FOLATE, FERRITIN, TIBC, IRON, RETICCTPCT in the last 72 hours. Urine analysis:    Component Value Date/Time   COLORURINE STRAW (A) 02/18/2024 0424   APPEARANCEUR CLEAR (A) 02/18/2024 0424   LABSPEC 1.015 02/18/2024 0424   PHURINE 6.0 02/18/2024 0424   GLUCOSEU 50 (A) 02/18/2024 0424   HGBUR NEGATIVE 02/18/2024 0424   BILIRUBINUR NEGATIVE 02/18/2024 0424   BILIRUBINUR small 08/15/2023 1116   KETONESUR NEGATIVE 02/18/2024 0424   PROTEINUR NEGATIVE 02/18/2024 0424   UROBILINOGEN negative (A) 08/15/2023 1116   NITRITE NEGATIVE 02/18/2024 0424   LEUKOCYTESUR NEGATIVE 02/18/2024 0424   Sepsis Labs: @LABRCNTIP (procalcitonin:4,lacticidven:4)  ) Recent Results (from the past 240 hours)  Resp panel by RT-PCR (RSV, Flu A&B, Covid) Anterior Nasal Swab     Status: None   Collection Time: 02/18/24  2:37 AM   Specimen: Anterior Nasal Swab  Result Value Ref Range Status   SARS Coronavirus 2 by RT PCR NEGATIVE NEGATIVE Final    Comment: (NOTE) SARS-CoV-2 target nucleic acids are NOT DETECTED.  The SARS-CoV-2 RNA is generally detectable in upper respiratory specimens during the acute phase of infection. The lowest concentration of SARS-CoV-2 viral copies this assay can detect is 138 copies/mL. A negative result does not preclude SARS-Cov-2 infection and should not be used as the sole basis for treatment or other patient management decisions. A negative  result may occur with  improper specimen collection/handling, submission of specimen other than nasopharyngeal swab, presence of viral mutation(s) within the areas targeted by this assay, and inadequate number of viral copies(<138 copies/mL). A negative result must be combined with clinical observations, patient history, and epidemiological information. The expected result is Negative.  Fact Sheet for Patients:  BloggerCourse.com  Fact Sheet for Healthcare Providers:  SeriousBroker.it  This test is no t yet approved or cleared by the [place]  FDA and  has been authorized for detection and/or diagnosis of SARS-CoV-2 by FDA under an Emergency Use Authorization (EUA). This EUA will remain  in effect (meaning this test can be used) for the duration of the COVID-19 declaration under Section 564(b)(1) of the Act, 21 U.S.C.section 360bbb-3(b)(1), unless the authorization is terminated  or revoked sooner.  Influenza A by PCR NEGATIVE NEGATIVE Final   Influenza B by PCR NEGATIVE NEGATIVE Final    Comment: (NOTE) The Xpert Xpress SARS-CoV-2/FLU/RSV plus assay is intended as an aid in the diagnosis of influenza from Nasopharyngeal swab specimens and should not be used as a sole basis for treatment. Nasal washings and aspirates are unacceptable for Xpert Xpress SARS-CoV-2/FLU/RSV testing.  Fact Sheet for Patients: BloggerCourse.com  Fact Sheet for Healthcare Providers: SeriousBroker.it  This test is not yet approved or cleared by the [place]  FDA and has been authorized for detection and/or diagnosis of SARS-CoV-2 by FDA under an Emergency Use Authorization (EUA). This EUA will remain in effect (meaning this test can be used) for the duration of the COVID-19 declaration under Section 564(b)(1) of the Act, 21 U.S.C. section 360bbb-3(b)(1), unless the authorization is  terminated or revoked.     Resp Syncytial Virus by PCR NEGATIVE NEGATIVE Final    Comment: (NOTE) Fact Sheet for Patients: BloggerCourse.com  Fact Sheet for Healthcare Providers: SeriousBroker.it  This test is not yet approved or cleared by the [place]  FDA and has been authorized for detection and/or diagnosis of SARS-CoV-2 by FDA under an Emergency Use Authorization (EUA). This EUA will remain in effect (meaning this test can be used) for the duration of the COVID-19 declaration under Section 564(b)(1) of the Act, 21 U.S.C. section 360bbb-3(b)(1), unless the authorization is terminated or revoked.  Performed at [facility], [address] [place], [place] [address]          Radiology Studies: ECHOCARDIOGRAM COMPLETE Result Date: 02/18/2024    ECHOCARDIOGRAM REPORT   Patient Name:   CARMON BRIGANDI Date of Exam: 02/18/2024 Medical Rec #:  [id number]        Height:       62.0 in Accession #:    [id number]       Weight:       130.0 lb Date of Birth:  09-Oct-1927        BSA:          1.592 m Patient Age:    89 years         BP:           144/81 mmHg Patient Gender: F                HR:           76 bpm. Exam Location:  [facility] Procedure: 2D Echo, Color Doppler and Cardiac Doppler (Both Spectral and Color            Flow Doppler were utilized during procedure). Indications:     TIA G45.9  History:         Patient has no prior history of Echocardiogram examinations.                  TIA; Risk Factors:Hypertension, Diabetes and Dyslipidemia.  Sonographer:     Thea Norlander RCS Referring Phys:  [id number] CORT ONEIDA MANA Diagnosing Phys: Lonni Hanson MD  Sonographer Comments: Technically difficult study due to poor echo windows, suboptimal apical window, suboptimal parasternal window and Technically challenging study due to limited acoustic windows. Image acquisition challenging due to patient body habitus. IMPRESSIONS  1. Left  ventricular ejection fraction, by estimation, is >55%. The left ventricle has normal function. Left ventricular endocardial border not optimally defined to evaluate regional wall motion. There is mild left ventricular hypertrophy. Left ventricular diastolic parameters are consistent with Grade I diastolic dysfunction (impaired relaxation).  2. Right ventricular systolic function is moderately reduced. The right ventricular size is normal. Mildly increased right ventricular wall thickness. There is normal pulmonary artery systolic pressure.  3. The mitral valve was not well visualized. No evidence of mitral valve regurgitation. No evidence of mitral stenosis.  4. The aortic valve is tricuspid. There is mild calcification of the aortic valve. There is mild thickening of the aortic valve. Aortic valve regurgitation is not visualized. Aortic valve sclerosis/calcification is present, without any evidence of aortic stenosis.  5. There is mild dilatation of the ascending aorta, measuring 40 mm.  6. The inferior vena cava is normal in size with <50% respiratory variability, suggesting right atrial pressure of 8 mmHg. FINDINGS  Left Ventricle: Left ventricular ejection fraction, by estimation, is >55%. The left ventricle has normal function. Left ventricular endocardial border not optimally defined to evaluate regional wall motion. The left ventricular internal cavity size was  normal in size. There is mild left ventricular hypertrophy. Left ventricular diastolic parameters are consistent with Grade I diastolic dysfunction (impaired relaxation). Right Ventricle: The right ventricular size is normal. Mildly increased right ventricular wall thickness. Right ventricular systolic function is moderately reduced. There is normal pulmonary artery systolic pressure. The tricuspid regurgitant velocity is  2.48 m/s, and with an assumed right atrial pressure of 8 mmHg, the estimated right ventricular systolic pressure is 32.6 mmHg.  Left Atrium: Left atrial size was not well visualized. Right Atrium: Right atrial size was not well visualized. Pericardium: There is no evidence of pericardial effusion. Mitral Valve: The mitral valve was not well visualized. No evidence of mitral valve regurgitation. No evidence of mitral valve stenosis. Tricuspid Valve: The tricuspid valve is not well visualized. Tricuspid valve regurgitation is mild. Aortic Valve: The aortic valve is tricuspid. There is mild calcification of the aortic valve. There is mild thickening of the aortic valve. Aortic valve regurgitation is not visualized. Aortic valve sclerosis/calcification is present, without any evidence of aortic stenosis. Aortic valve peak gradient measures 8.4 mmHg. Pulmonic Valve: The pulmonic valve was not well visualized. Pulmonic valve regurgitation is trivial. No evidence of pulmonic stenosis. Aorta: The aortic root is normal in size and structure. There is mild dilatation of the ascending aorta, measuring 40 mm. Pulmonary Artery: The pulmonary artery is not well seen. Venous: The inferior vena cava is normal in size with less than 50% respiratory variability, suggesting right atrial pressure of 8 mmHg. IAS/Shunts: No atrial level shunt detected by color flow Doppler.  LEFT VENTRICLE PLAX 2D LVIDd:         2.80 cm   Diastology LVIDs:         2.10 cm   LV e' medial:    5.55 cm/s LV PW:         1.10 cm   LV E/e' medial:  11.3 LV IVS:        1.20 cm   LV e' lateral:   6.42 cm/s LVOT diam:     2.20 cm   LV E/e' lateral: 9.8 LV SV:         52 LV SV Index:   32 LVOT Area:     3.80 cm  RIGHT VENTRICLE            IVC RV S prime:     6.20 cm/s  IVC diam: 1.70 cm LEFT ATRIUM         Index LA diam:    2.20 cm 1.38 cm/m  AORTIC VALVE AV Area (Vmax): 1.96  cm AV Vmax:        145.00 cm/s AV Peak Grad:   8.4 mmHg LVOT Vmax:      74.90 cm/s LVOT Vmean:     49.800 cm/s LVOT VTI:       0.136 m  AORTA Ao Root diam: 3.10 cm Ao Asc diam:  4.00 cm MITRAL VALVE                 TRICUSPID VALVE MV Area (PHT): 2.48 cm     TR Peak grad:   24.6 mmHg MV Decel Time: 306 msec     TR Vmax:        248.00 cm/s MR Peak grad: 12.0 mmHg MR Vmax:      173.00 cm/s   SHUNTS MV E velocity: 62.90 cm/s   Systemic VTI:  0.14 m MV A velocity: 106.00 cm/s  Systemic Diam: 2.20 cm MV E/A ratio:  0.59 Lonni End MD Electronically signed by Lonni Hanson MD Signature Date/Time: 02/18/2024/6:27:03 PM    Final    US  Carotid Bilateral Result Date: 02/18/2024 CLINICAL DATA:  Transient ischemic attack EXAM: BILATERAL CAROTID DUPLEX ULTRASOUND TECHNIQUE: Elnor scale imaging, color Doppler and duplex ultrasound were performed of bilateral carotid and vertebral arteries in the neck. COMPARISON:  None Available. FINDINGS: Criteria: Quantification of carotid stenosis is based on velocity parameters that correlate the residual internal carotid diameter with NASCET-based stenosis levels, using the diameter of the distal internal carotid lumen as the denominator for stenosis measurement. The following velocity measurements were obtained: RIGHT ICA: 39/11 cm/sec CCA: 53/11 cm/sec SYSTOLIC ICA/CCA RATIO:  0.7 ECA:  26 cm/sec LEFT ICA: 71 over 7 cm/sec CCA: 65/17 cm/sec SYSTOLIC ICA/CCA RATIO:  1.1 ECA:  35 cm/sec RIGHT CAROTID ARTERY: Trace mild heterogeneous atherosclerotic plaque without evidence of stenosis. RIGHT VERTEBRAL ARTERY:  Patent with normal antegrade flow. LEFT CAROTID ARTERY: Trace heterogeneous atherosclerotic plaque without evidence of stenosis. LEFT VERTEBRAL ARTERY:  Patent with normal antegrade flow. IMPRESSION: 1. Trace atherosclerotic plaque in the bilateral internal carotid arteries without evidence of associated stenosis. 2. Vertebral arteries are patent with normal antegrade flow. Electronically Signed   By: Wilkie Lent M.D.   On: 02/18/2024 11:46   MR HIP RIGHT WO CONTRAST Result Date: 02/18/2024 CLINICAL DATA:  Right hip pain and weakness after falling. Clinical concern or fracture. EXAM:  MR OF THE RIGHT HIP WITHOUT CONTRAST TECHNIQUE: Multiplanar, multisequence MR imaging was performed. No intravenous contrast was administered. COMPARISON:  Radiographs 02/18/2024 FINDINGS: Bones: Patient is status post bilateral total hip arthroplasty. There is associated moderate susceptibility artifact. Allowing for this artifact, no evidence of acute fracture or dislocation. The visualized bony pelvis appears normal. The visualized sacroiliac joints and symphysis pubis appear normal. Joint or bursal effusion Joint effusion: No significant hip joint effusion. Bursae: There is prominent soft tissue edema surrounding the proximal right femur, further described below. No focal periarticular fluid collection identified. Muscles and tendons Muscles and tendons: There is a large amount of edema within and surrounding the right iliopsoas muscle and tendon. The tendon appears completely avulsed from the lesser trochanter with surrounding ill-defined fluid. There is lesser edema within the proximal right thigh adductor musculature. The left iliopsoas, bilateral common hamstring and gluteus tendons appear intact. There is mild gluteus tendinosis bilaterally. Other findings Miscellaneous: Prominent subcutaneous fat within the left groin, measuring up to 2.9 cm on coronal image 32/21, favored to reflect a lipoma over a fat containing hernia. 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  IMPRESSION: 1. Complete avulsion of the right iliopsoas tendon from the lesser trochanter with surrounding soft tissue edema. 2. No evidence of acute fracture or dislocation. 3. Status post bilateral total hip arthroplasty. 4. Prominent subcutaneous fat within the left groin, favored to reflect a lipoma over a fat containing hernia. Correlate clinically. Electronically Signed   By: Elsie Perone M.D.   On: 02/18/2024 08:44   MR BRAIN WO CONTRAST Result Date: 02/18/2024 CLINICAL DATA:  89 year old female with altered mental status.  Weakness. Dizziness. EXAM: MRI HEAD WITHOUT CONTRAST TECHNIQUE: Multiplanar, multiecho pulse sequences of the brain and surrounding structures were obtained without intravenous contrast. COMPARISON:  Head CT 01/18/2023 and earlier. Cervical spine CT 06/18/2022. FINDINGS: Brain: No restricted diffusion to suggest acute infarction. No midline shift, mass effect, evidence of mass lesion, ventriculomegaly, extra-axial collection or acute intracranial hemorrhage. 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 Stable cerebral volume since last year. Metal hardware susceptibility artifact at the craniocervical junction. Patchy and confluent bilateral cerebral white matter T2 and FLAIR hyperintensity. Scattered small chronic infarcts in the bilateral cerebellum. Comparatively mild T2 heterogeneity in the pons. Mild T2 and FLAIR heterogeneity in the deep gray nuclei. No cortical encephalomalacia identified. No definite chronic cerebral blood products on SWI. Vascular: Major intracranial vascular flow voids are preserved. Some generalized intracranial artery tortuosity, and dominant appearing distal left vertebral artery. Skull and upper cervical spine: Chronically abnormal craniocervical junction with posterior fusion hardware, left suboccipital bone screw contacting only a small portion of the left C1 ring as demonstrated by CT last year. But there has been evidence of multilevel upper cervical arthrodesis. Underlying chronic displaced odontoid fracture. And subsequent multifactorial stenosis at the cervicomedullary junction (series 7, image 12). No lower brainstem encephalomalacia is evident. And grossly maintained signal and volume of the visible cervical spinal cord. Normal background bone marrow signal. Sinuses/Orbits: Postoperative changes to the globes. Mild to moderate left maxillary sinus mucosal thickening. Other paranasal sinuses and mastoids are well aerated. Other: Grossly normal visible internal auditory structures. Chronic postoperative  changes to the suboccipital region, posterior neck. IMPRESSION: 1. No acute intracranial abnormality. 2. Chronic small vessel disease, most pronounced in the bilateral cerebellum and cerebral white matter. 3. Advanced chronic posttraumatic and postoperative deformity at the craniocervical junction, subsequent chronic cervicomedullary junction stenosis. Electronically Signed   By: VEAR Hurst M.D.   On: 02/18/2024 07:25   DG Hip Unilat W or Wo Pelvis 2-3 Views Right Result Date: 02/18/2024 EXAM: 2 or 3 VIEW(S) XRAY OF THE PELVIS AND RIGHT HIP 02/18/2024 05:50:43 AM COMPARISON: None available. CLINICAL HISTORY: BIB [facility] from [facility] with CC of R hip pain. Upon assessing pt, answers are not consistent. Pt is able to make conversation flow but content is often not relevant to CC. At this time pt is oriented to person and place only. FINDINGS: JOINTS: The patient is status post bilateral total hip arthroplasty. No signs of periprosthetic fracture or dislocation. SOFT TISSUES: Moderate distention of the bowel loops with gas and stool. Lateral extension of the left lower quadrant bowel loops beyond the margins of the left iliac bone may reflect underlying hernia. VASCULATURE: Vascular calcifications noted. DISCS/DEGENERATIVE CHANGES: Degenerative changes noted within the imaged portions of the lumbar spine. IMPRESSION: 1. No acute abnormality of the right hip. 2. Status post bilateral total hip arthroplasty without signs of periprosthetic fracture or dislocation. 3. Moderate distention of the bowel loops with gas and stool. Lateral extension of the left lower quadrant bowel loops beyond the margins of the left  iliac bone may reflect underlying hernia. Electronically signed by: Waddell Calk MD 02/18/2024 06:18 AM EDT RP Workstation: [id number]        Scheduled Meds:  acetaminophen   325 mg Oral Daily   ARIPiprazole   5 mg Oral Daily   diltiazem   10 mg Intravenous Once   enoxaparin  (LOVENOX )  injection  30 mg Subcutaneous Q24H   ezetimibe   10 mg Oral Daily   insulin  aspart  0-9 Units Subcutaneous TID WC   latanoprost   1 drop Both Eyes QHS   lisinopril   20 mg Oral Daily   metformin   2,000 mg Oral Q breakfast   Continuous Infusions:   LOS: 0 days     Devaughn KATHEE Ban, MD [facility]   If 7PM-7AM, please contact night-coverage [email or web address] Password [email or web address] 02/19/2024, 1:51 PM

## 2024-02-20 ENCOUNTER — Observation Stay: Payer: MEDICARE

## 2024-02-20 DIAGNOSIS — M25551 Pain in right hip: Secondary | ICD-10-CM | POA: Diagnosis not present

## 2024-02-20 LAB — GLUCOSE, CAPILLARY
Glucose-Capillary: 119 mg/dL — ABNORMAL HIGH (ref 70–99)
Glucose-Capillary: 119 mg/dL — ABNORMAL HIGH (ref 70–99)

## 2024-02-20 MED ORDER — HALOPERIDOL LACTATE 5 MG/ML IJ SOLN: 0.5000 mg | Freq: Four times a day (QID) | INTRAMUSCULAR | Status: DC | PRN

## 2024-02-20 MED ORDER — HALOPERIDOL LACTATE 5 MG/ML IJ SOLN
2.0000 mg | Freq: Four times a day (QID) | INTRAMUSCULAR | Status: DC | PRN
  Filled 2024-02-20: qty 1

## 2024-02-20 NOTE — Progress Notes (Addendum)
 Pt confused and trying to get out the bed, when trying to redirect, pt does display irritability regarding unable to walk around freely and not having her normal clothing in the room. This care RN trying to stay within arm reach or contact guard to prevent patient from falling.   Provider and change RN notified. Pt continues to state that she is looking for Kristin Deleon (her grandson per patient).  Pt after currently sitting at the edge of the bed calmly.

## 2024-02-20 NOTE — Progress Notes (Signed)
 Mobility Specialist - Progress Note     02/20/24 1048  Mobility  Activity Ambulated with assistance;Stood at bedside  Level of Assistance Standby assist, set-up cues, supervision of patient - no hands on  Assistive Device Front wheel walker  Distance Ambulated (ft) 320 ft  Range of Motion/Exercises Active  RLE Weight Bearing Per Provider Order WBAT  Activity Response Tolerated well  Mobility Referral Yes  Mobility visit 1 Mobility  Mobility Specialist Start Time (ACUTE ONLY) 1021  Mobility Specialist Stop Time (ACUTE ONLY) 1042  Mobility Specialist Time Calculation (min) (ACUTE ONLY) 21 min   Pt sleeping in recliner upon entry on RA. Pt STS and ambulates to hallway around NS SBA with RW for two laps. Pt gait is slow but moderately steady endorsing pain in right hip. Pt soft collar on. Pt returned to recliner and left with needs in reach. Chair alarm activated. Daughter present in room.   Guido Rumble Mobility Specialist 02/20/24, 10:53 AM

## 2024-02-20 NOTE — Plan of Care (Signed)
 Pt was able to rest overnight after initial confusion episode with some redirection. Pt ambulated in the hallway the second time she was up but much more redirectable.   Problem: Education: Goal: Ability to describe self-care measures that may prevent or decrease complications (Diabetes Survival Skills Education) will improve Outcome: Progressing Goal: Individualized Educational Video(s) Outcome: Progressing   Problem: Coping: Goal: Ability to adjust to condition or change in health will improve Outcome: Progressing   Problem: Fluid Volume: Goal: Ability to maintain a balanced intake and output will improve Outcome: Progressing   Problem: Health Behavior/Discharge Planning: Goal: Ability to identify and utilize available resources and services will improve Outcome: Progressing Goal: Ability to manage health-related needs will improve Outcome: Progressing   Problem: Metabolic: Goal: Ability to maintain appropriate glucose levels will improve Outcome: Progressing   Problem: Nutritional: Goal: Maintenance of adequate nutrition will improve Outcome: Progressing Goal: Progress toward achieving an optimal weight will improve Outcome: Progressing   Problem: Skin Integrity: Goal: Risk for impaired skin integrity will decrease Outcome: Progressing   Problem: Tissue Perfusion: Goal: Adequacy of tissue perfusion will improve Outcome: Progressing   Problem: Education: Goal: Knowledge of disease or condition will improve Outcome: Progressing Goal: Knowledge of secondary prevention will improve (MUST DOCUMENT ALL) Outcome: Progressing Goal: Knowledge of patient specific risk factors will improve (DELETE if not current risk factor) Outcome: Progressing   Problem: Ischemic Stroke/TIA Tissue Perfusion: Goal: Complications of ischemic stroke/TIA will be minimized Outcome: Progressing   Problem: Coping: Goal: Will verbalize positive feelings about self Outcome: Progressing Goal:  Will identify appropriate support needs Outcome: Progressing   Problem: Health Behavior/Discharge Planning: Goal: Ability to manage health-related needs will improve Outcome: Progressing Goal: Goals will be collaboratively established with patient/family Outcome: Progressing   Problem: Self-Care: Goal: Ability to participate in self-care as condition permits will improve Outcome: Progressing Goal: Verbalization of feelings and concerns over difficulty with self-care will improve Outcome: Progressing Goal: Ability to communicate needs accurately will improve Outcome: Progressing   Problem: Nutrition: Goal: Risk of aspiration will decrease Outcome: Progressing Goal: Dietary intake will improve Outcome: Progressing   Problem: Education: Goal: Knowledge of General Education information will improve Description: Including pain rating scale, medication(s)/side effects and non-pharmacologic comfort measures Outcome: Progressing   Problem: Health Behavior/Discharge Planning: Goal: Ability to manage health-related needs will improve Outcome: Progressing   Problem: Clinical Measurements: Goal: Ability to maintain clinical measurements within normal limits will improve Outcome: Progressing Goal: Will remain free from infection Outcome: Progressing Goal: Diagnostic test results will improve Outcome: Progressing Goal: Respiratory complications will improve Outcome: Progressing Goal: Cardiovascular complication will be avoided Outcome: Progressing   Problem: Activity: Goal: Risk for activity intolerance will decrease Outcome: Progressing   Problem: Nutrition: Goal: Adequate nutrition will be maintained Outcome: Progressing   Problem: Coping: Goal: Level of anxiety will decrease Outcome: Progressing   Problem: Elimination: Goal: Will not experience complications related to bowel motility Outcome: Progressing Goal: Will not experience complications related to urinary  retention Outcome: Progressing   Problem: Pain Managment: Goal: General experience of comfort will improve and/or be controlled Outcome: Progressing   Problem: Safety: Goal: Ability to remain free from injury will improve Outcome: Progressing   Problem: Skin Integrity: Goal: Risk for impaired skin integrity will decrease Outcome: Progressing

## 2024-02-20 NOTE — Progress Notes (Signed)
 Right at shift change, pt's bed alarm was going off, patient was found on the floor mat on her knees facing the bed, holding on to bed trying to get back up. Pt stated, she was trying to get up and slid out of the bed. No obvious trauma, pt denied hitting head. With two people assist, patient was able to get up and ambulate at baseline. Pt did c/o right hip pain but stated, its not new. VSS. Pt is not in any acute distress. Calmly sitting in a recliner chair currently. Pt once again educated on importance of adhering to falls precaution prevention interventions.  At the time of event. Pt had blue non-slip socks on, yellow arm band, bed alarm on moderate sensitivity, and floor mat on the floor where she was on her knee.    AM and PM shift RN notified of the fall along with primary provider, Dr. Kandis.

## 2024-02-20 NOTE — Progress Notes (Signed)
   02/20/24 0715  What Happened  Was fall witnessed? No  Was patient injured? No  Patient found on floor  Found by Staff-comment Noreen Blanch, RN)  Stated prior activity to/from bed, chair, or stretcher  Provider Notification  Provider Name/Title Dr. Kandis  Date Provider Notified 02/20/24  Time Provider Notified [phone]  Method of Notification Page  Notification Reason Fall  Follow Up  Family notified Yes - comment  Time family notified [phone]  Additional tests No  Progress note created (see row info) Yes  Adult Fall Risk Assessment  Risk Factor Category (scoring not indicated) High fall risk per protocol (document High fall risk)  Age 89  Fall History: Fall within 6 months prior to admission 0  Elimination; Bowel and/or Urine Incontinence 2  Elimination; Bowel and/or Urine Urgency/Frequency 0  Medications: includes PCA/Opiates, Anti-convulsants, Anti-hypertensives, Diuretics, Hypnotics, Laxatives, Sedatives, and Psychotropics 5  Patient Care Equipment 1  Mobility-Assistance 2  Mobility-Gait 2  Mobility-Sensory Deficit 2  Altered awareness of immediate physical environment 0  Impulsiveness 2  Lack of understanding of one's physical/cognitive limitations 4  Total Score 23  Patient Fall Risk Level High fall risk  Adult Fall Risk Interventions  Required Bundle Interventions *See Row Information* High fall risk - low, moderate, and high requirements implemented  Fall intervention(s) refused/Patient educated regarding refusal Bed alarm;Nonskid socks;Open door if unsupervised;Yellow bracelet;Supervision while toileting/edge of bed sitting  Screening for Fall Injury Risk (To be completed on HIGH fall risk patients) - Assessing Need for Floor Mats  Risk For Fall Injury- Criteria for Floor Mats Confusion/dementia (+NuDESC, CIWA, TBI, etc.);Previous fall this admission;85 years or older  Will Implement Floor Mats Yes  Vitals  Temp 98.3 F (36.8 C)  Temp Source Oral  BP (!) 151/90   MAP (mmHg) 108  BP Location Right Arm  BP Method Automatic  Patient Position (if appropriate) Sitting  Pulse Rate 99  Pulse Rate Source Monitor  Resp 18  Oxygen Therapy  SpO2 98 %  Pain Assessment  Pain Scale 0-10  Pain Score 4  Pain Type Chronic pain  Pain Location Hip  Pain Orientation Right  Pain Radiating Towards none  Pain Descriptors / Indicators Aching  Pain Frequency Intermittent  Pain Onset On-going  Patients Stated Pain Goal 2  Neurological  Neuro (WDL) X  Level of Consciousness Alert  Orientation Level Oriented to person;Disoriented to time;Disoriented to place;Disoriented to situation  Economist Clear  R Pupil Size (mm) 3  R Pupil Shape Round  R Pupil Reaction Brisk  L Pupil Size (mm) 3  L Pupil Shape Round  L Pupil Reaction Brisk  Motor Function/Sensation Assessment Motor response;Grip  R Hand Grip Moderate  L Hand Grip Moderate  RUE Motor Response Purposeful movement  LUE Motor Response Purposeful movement  RLE Motor Response Purposeful movement  LLE Motor Response Purposeful movement  Neuro Symptoms Forgetful  Musculoskeletal  Musculoskeletal (WDL) X  Assistive Device Front wheel walker  Generalized Weakness Yes  Weight Bearing Restrictions Per Provider Order Yes  RLE Weight Bearing Per Provider Order WBAT  Musculoskeletal Details  Neck Limited movement;Ortho/Supportive Device  Neck Ortho/Supportive Device Collar  Collar On and aligned

## 2024-02-20 NOTE — TOC Transition Note (Signed)
 Transition of Care [facility]) - Discharge Note   Patient Details  Name: Kristin Deleon MRN: [id number] Date of Birth: May 12, 1928  [facility]) CM/SW Contact:  [place] L Leron Stoffers, LCSW Phone Number: 02/20/2024, 1:05 PM   Clinical Narrative:     Patient ready for discharge at [facility]. [facility] scheduled for transport. Daughter was contacted but a voicemail message was left advising that patient is ready for discharge and transportation has been scheduled.   No further TOC needs identified. TOC signing off.         Patient Goals and CMS Choice            Discharge Placement                       Discharge Plan and Services Additional resources added to the After Visit Summary for                                       Social Drivers of Health (SDOH) Interventions SDOH Screenings   Food Insecurity: No Food Insecurity (02/18/2024)  Housing: Low Risk  (02/18/2024)  Transportation Needs: No Transportation Needs (02/18/2024)  Utilities: Not At Risk (02/18/2024)  Alcohol Screen: Low Risk  (06/27/2023)  Depression (PHQ2-9): Low Risk  (01/03/2024)  Financial Resource Strain: Low Risk  (06/27/2023)  Physical Activity: Insufficiently Active (06/27/2023)  Social Connections: Moderately Isolated (02/18/2024)  Stress: Stress Concern Present (06/27/2023)  Tobacco Use: Low Risk  (01/16/2024)   Received from [facility]     Readmission Risk Interventions     No data to display

## 2024-02-21 ENCOUNTER — Encounter: Payer: Self-pay | Admitting: Student

## 2024-02-21 ENCOUNTER — Non-Acute Institutional Stay (SKILLED_NURSING_FACILITY): Payer: MEDICARE | Admitting: Student

## 2024-02-21 DIAGNOSIS — G301 Alzheimer's disease with late onset: Secondary | ICD-10-CM

## 2024-02-21 DIAGNOSIS — R42 Dizziness and giddiness: Secondary | ICD-10-CM

## 2024-02-21 DIAGNOSIS — N189 Chronic kidney disease, unspecified: Secondary | ICD-10-CM

## 2024-02-21 DIAGNOSIS — I10 Essential (primary) hypertension: Secondary | ICD-10-CM | POA: Diagnosis not present

## 2024-02-21 DIAGNOSIS — F02A3 Dementia in other diseases classified elsewhere, mild, with mood disturbance: Secondary | ICD-10-CM

## 2024-02-21 DIAGNOSIS — F333 Major depressive disorder, recurrent, severe with psychotic symptoms: Secondary | ICD-10-CM

## 2024-02-21 DIAGNOSIS — E785 Hyperlipidemia, unspecified: Secondary | ICD-10-CM

## 2024-02-21 DIAGNOSIS — S76811D Strain of other specified muscles, fascia and tendons at thigh level, right thigh, subsequent encounter: Secondary | ICD-10-CM | POA: Diagnosis not present

## 2024-02-21 DIAGNOSIS — E1165 Type 2 diabetes mellitus with hyperglycemia: Secondary | ICD-10-CM

## 2024-02-21 NOTE — Progress Notes (Signed)
 Provider:  Dr. Richerd Brigham Location:  Other [place].  Nursing Home Room Number: [facility] [id number] Place of Service:  SNF (31)  PCP: Brigham Richerd, MD Patient Care Team: Brigham Richerd, MD as PCP - General (Family Medicine) [place], [facility])  Extended Emergency Contact Information Primary Emergency Contact: Jude Sotero RAMAN Address: [address]          [place], [place] [address] [place] Home Phone: [phone] Mobile Phone: [phone] Relation: Daughter  Code Status:  Goals of Care: Advanced Directive information    02/18/2024    5:35 PM  Advanced Directives  Does Patient Have a Medical Advance Directive? Yes  Type of Advance Directive Out of facility DNR (pink MOST or yellow form)  Does patient want to make changes to medical advance directive? No - Patient declined      Chief Complaint  Patient presents with   New Admit To SNF    Admission.     HPI: Patient is a 89 y.o. female seen today for admission to [facility]  History of Present Illness The patient is a 89 year old with dementia who presents with right hip pain and weakness.  She reports tearing a tendon in her hip while cutting grass and trimming shrubs, leading to an inability to lift her leg. This resulted in her sleeping on the floor using blankets and a stool for support. She was found on the floor the next day, and it was assumed she had fallen.  She visited a surgeon who performed an x-ray after she fell down the back steps, resulting in a visible injury to her eye. Initially, she thought she might have broken her arm. An MRI was ordered, which showed a right iliopsoas tendon avulsion.  She experiences moderate right hip pain and weakness in the right lower extremity. Pain is moderately controlled with Tylenol . She is currently in a rehab facility to regain strength.  She has a history of dementia and is currently disoriented to time and place. She  reports eating and drinking well, with regular bowel movements.   Social History - Living Situation: Currently in a rehab facility. - The patient has a daughter who visits and a son who lives in [place]  and plans to visit. The patient lives in a house.  Results RADIOLOGY MRI: Negative for stroke (02/21/2024) Right hip MRI: Right iliopsoas tendon avulsion   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   Diabetes mellitus without complication (HCC)    metformin -oral meds   HOH (hard of hearing)    hearing aids/ bilateral   Hypercholesteremia    Hypertension    moderate/ controlled on meds   Meniere syndrome    PONV (postoperative nausea and vomiting)    difficulty waking up   Past Surgical History:  Procedure Laterality Date   ABDOMINAL HYSTERECTOMY     CATARACT EXTRACTION     CATARACT EXTRACTION W/PHACO Right 09/19/2016   Procedure: CATARACT EXTRACTION PHACO AND INTRAOCULAR LENS PLACEMENT (IOC) Diabetic  Right eye;  Surgeon: Dene Etienne, MD;  Location: [facility];  Service: Ophthalmology;  Laterality: Right;  Diabetic-oral med   CERVICAL FUSION     for fracture C1 and C2   CHOLECYSTECTOMY     ESOPHAGOGASTRODUODENOSCOPY (EGD) WITH PROPOFOL  N/A 05/28/2017   Procedure: ESOPHAGOGASTRODUODENOSCOPY (EGD) WITH PROPOFOL ;  Surgeon: Toledo, Ladell POUR, MD;  Location: [facility];  Service: Gastroenterology;  Laterality: N/A;   JOINT  REPLACEMENT Bilateral    knee and hip   KNEE ARTHROSCOPY     multiple   Morton's neuroma removed from foot     ROTATOR CUFF REPAIR Right    TONSILLECTOMY      reports that she has never smoked. She has never used smokeless tobacco. She reports current alcohol use of about 2.0 standard drinks of alcohol per week. She reports that she does not use drugs. Social History   Socioeconomic History   Marital status: Widowed    Spouse name: Not on file   Number of children: Not on file   Years of education: Not  on file   Highest education level: Master's degree (e.g., MA, MS, MEng, MEd, MSW, MBA)  Occupational History   Not on file  Tobacco Use   Smoking status: Never   Smokeless tobacco: Never  Vaping Use   Vaping status: Never Used  Substance and Sexual Activity   Alcohol use: Yes    Alcohol/week: 2.0 standard drinks of alcohol    Types: 2 Glasses of wine per week   Drug use: No   Sexual activity: Not on file  Other Topics Concern   Not on file  Social History Narrative   Not on file   Social Drivers of Health   Financial Resource Strain: Low Risk  (06/27/2023)   Overall Financial Resource Strain (CARDIA)    Difficulty of Paying Living Expenses: Not hard at all  Food Insecurity: No Food Insecurity (02/18/2024)   Hunger Vital Sign    Worried About Running Out of Food in the Last Year: Never true    Ran Out of Food in the Last Year: Never true  Transportation Needs: No Transportation Needs (02/18/2024)   PRAPARE - Administrator, Civil Service (Medical): No    Lack of Transportation (Non-Medical): No  Physical Activity: Insufficiently Active (06/27/2023)   Exercise Vital Sign    Days of Exercise per Week: 3 days    Minutes of Exercise per Session: 30 min  Stress: Stress Concern Present (06/27/2023)   [employer] of Occupational Health - Occupational Stress Questionnaire    Feeling of Stress : To some extent  Social Connections: Moderately Isolated (02/18/2024)   Social Connection and Isolation Panel    Frequency of Communication with Friends and Family: More than three times a week    Frequency of Social Gatherings with Friends and Family: Twice a week    Attends Religious Services: Never    Database administrator or Organizations: Yes    Attends Banker Meetings: 1 to 4 times per year    Marital Status: Widowed  Intimate Partner Violence: Not At Risk (02/18/2024)   Humiliation, Afraid, Rape, and Kick questionnaire    Fear of Current or Ex-Partner: No     Emotionally Abused: No    Physically Abused: No    Sexually Abused: No    Functional Status Survey:    Family History  Problem Relation Age of Onset   Hypertension Sister    Diabetes Sister    Thyroid  disease Sister    Bipolar disorder Son    Diabetes Maternal Grandmother     Health Maintenance  Topic Date Due   DEXA SCAN  Never done   DTaP/Tdap/Td (2 - Td or Tdap) 05/12/2020   Medicare Annual Wellness (AWV)  08/22/2023   OPHTHALMOLOGY EXAM  10/18/2023   Influenza Vaccine  01/10/2024   COVID-19 Vaccine (9 - Moderna risk 2024-25 season) 03/21/2024  FOOT EXAM  03/26/2024   HEMOGLOBIN A1C  07/08/2024   Pneumococcal Vaccine: 50+ Years  Completed   Zoster Vaccines- Shingrix  Completed   HPV VACCINES  Aged Out   Meningococcal B Vaccine  Aged Out    Allergies  Allergen Reactions   Amlodipine Swelling    Lower leg edema   Tramadol Nausea Only   Codeine Nausea And Vomiting   Fentanyl  Nausea And Vomiting   Hydromorphone Hcl Nausea And Vomiting   Morphine Itching and Nausea And Vomiting    Other reaction(s): Vomiting/ feels like ants under skin   Nucynta [Tapentadol] Other (See Comments)    hallucinations   Statins     Muscle pain    Tegaderm Ag Mesh [Silver] Other (See Comments)    blisters   Tetracaine Itching   Tetracyclines & Related Rash    Outpatient Encounter Medications as of 02/21/2024  Medication Sig   Acetaminophen  325 MG CAPS Take by mouth.   ARIPiprazole  (ABILIFY ) 5 MG tablet Take 1 tablet (5 mg total) by mouth at bedtime.   ezetimibe  (ZETIA ) 10 MG tablet Take 1 tablet (10 mg total) by mouth daily.   glucose blood test strip Use to test blood sugar twice daily. Dx:E11.65   ipratropium (ATROVENT ) 0.03 % nasal spray Place 2 sprays into both nostrils 2 (two) times daily.   latanoprost  (XALATAN ) 0.005 % ophthalmic solution Place 1 drop into both eyes at bedtime. 1 drop per eye hour of sleep   meclizine  (ANTIVERT ) 25 MG tablet Take 1 tablet (25 mg total) by  mouth 3 (three) times daily as needed for dizziness.   metformin  (FORTAMET ) 1000 MG (OSM) 24 hr tablet Take 1 tablet (1,000 mg total) by mouth daily with breakfast.   Multiple Vitamins-Minerals (CENTRUM SILVER 50+WOMEN PO) Take by mouth. am   triamcinolone cream (KENALOG) 0.1 % Apply 1 application topically as needed. Apply externally to the affected area twice daily   vitamin B-12 (CYANOCOBALAMIN) 500 MCG tablet Take 1 tablet (500 mcg total) by mouth daily. dinner   No facility-administered encounter medications on file as of 02/21/2024.    Review of Systems  Vitals:   02/21/24 0845  BP: (!) 177/87  Pulse: 81  Resp: 18  Temp: (!) 97.2 F (36.2 C)  SpO2: 94%  Weight: 128 lb 12.8 oz (58.4 kg)  Height: 5' 2 (1.575 m)   Body mass index is 23.56 kg/m. Physical Exam Cardiovascular:     Rate and Rhythm: Normal rate and regular rhythm.     Pulses: Normal pulses.  Pulmonary:     Effort: Pulmonary effort is normal.  Musculoskeletal:     Comments: Physical Exam NEUROLOGICAL: Weakness in right lower extremity. Grip strength intact bilaterally.  Neurological:     Mental Status: She is alert.     Labs reviewed: Basic Metabolic Panel: Recent Labs    01/06/24 0849 02/18/24 0237 02/19/24 0514  NA 140 140 140  K 3.9 4.4 3.7  CL 102 105 106  CO2 29 26 25   GLUCOSE 171* 217* 192*  BUN 20 26* 22  CREATININE 1.05* 0.92 1.00  CALCIUM 9.8 8.7* 8.6*  MG  --   --  1.5*   Liver Function Tests: Recent Labs    01/06/24 0849 02/18/24 0237 02/19/24 0514  AST 15 23 16   ALT 10 8 10   ALKPHOS  --  50 55  BILITOT 0.5 1.4* 0.6  PROT 6.7 6.2* 6.0*  ALBUMIN  --  3.5 3.4*   No results  for input(s): LIPASE, AMYLASE in the last 8760 hours. No results for input(s): AMMONIA in the last 8760 hours. CBC: Recent Labs    10/07/23 0811 01/06/24 0849 02/18/24 0237 02/19/24 0514  WBC 6.8 7.6 7.8 10.1  NEUTROABS 3,203 4,043 4.5  --   HGB 12.9 13.0 11.0* 11.0*  HCT 41.0 40.9 34.4*  34.1*  MCV 91.1 92.3 91.7 90.7  PLT 252 206 193 189   Cardiac Enzymes: Recent Labs    02/18/24 0237  CKTOTAL 65   BNP: Invalid input(s): POCBNP Lab Results  Component Value Date   HGBA1C 7.1 (H) 01/06/2024   Lab Results  Component Value Date   TSH 2.848 02/18/2024   No results found for: VITAMINB12 No results found for: FOLATE No results found for: IRON, TIBC, FERRITIN  Imaging and Procedures obtained prior to SNF admission: ECHOCARDIOGRAM COMPLETE Result Date: 02/18/2024    ECHOCARDIOGRAM REPORT   Patient Name:   Kristin Deleon Date of Exam: 02/18/2024 Medical Rec #:  [id number]        Height:       62.0 in Accession #:    [id number]       Weight:       130.0 lb Date of Birth:  Apr 09, 1928        BSA:          1.592 m Patient Age:    89 years         BP:           144/81 mmHg Patient Gender: F                HR:           76 bpm. Exam Location:  [facility] Procedure: 2D Echo, Color Doppler and Cardiac Doppler (Both Spectral and Color            Flow Doppler were utilized during procedure). Indications:     TIA G45.9  History:         Patient has no prior history of Echocardiogram examinations.                  TIA; Risk Factors:Hypertension, Diabetes and Dyslipidemia.  Sonographer:     Thea Norlander RCS Referring Phys:  [id number] CORT ONEIDA MANA Diagnosing Phys: Lonni Hanson MD  Sonographer Comments: Technically difficult study due to poor echo windows, suboptimal apical window, suboptimal parasternal window and Technically challenging study due to limited acoustic windows. Image acquisition challenging due to patient body habitus. IMPRESSIONS  1. Left ventricular ejection fraction, by estimation, is >55%. The left ventricle has normal function. Left ventricular endocardial border not optimally defined to evaluate regional wall motion. There is mild left ventricular hypertrophy. Left ventricular diastolic parameters are consistent with Grade I diastolic dysfunction (impaired  relaxation).  2. Right ventricular systolic function is moderately reduced. The right ventricular size is normal. Mildly increased right ventricular wall thickness. There is normal pulmonary artery systolic pressure.  3. The mitral valve was not well visualized. No evidence of mitral valve regurgitation. No evidence of mitral stenosis.  4. The aortic valve is tricuspid. There is mild calcification of the aortic valve. There is mild thickening of the aortic valve. Aortic valve regurgitation is not visualized. Aortic valve sclerosis/calcification is present, without any evidence of aortic stenosis.  5. There is mild dilatation of the ascending aorta, measuring 40 mm.  6. The inferior vena cava is normal in size with <50% respiratory variability, suggesting right atrial pressure of 8 mmHg.  FINDINGS  Left Ventricle: Left ventricular ejection fraction, by estimation, is >55%. The left ventricle has normal function. Left ventricular endocardial border not optimally defined to evaluate regional wall motion. The left ventricular internal cavity size was  normal in size. There is mild left ventricular hypertrophy. Left ventricular diastolic parameters are consistent with Grade I diastolic dysfunction (impaired relaxation). Right Ventricle: The right ventricular size is normal. Mildly increased right ventricular wall thickness. Right ventricular systolic function is moderately reduced. There is normal pulmonary artery systolic pressure. The tricuspid regurgitant velocity is  2.48 m/s, and with an assumed right atrial pressure of 8 mmHg, the estimated right ventricular systolic pressure is 32.6 mmHg. Left Atrium: Left atrial size was not well visualized. Right Atrium: Right atrial size was not well visualized. Pericardium: There is no evidence of pericardial effusion. Mitral Valve: The mitral valve was not well visualized. No evidence of mitral valve regurgitation. No evidence of mitral valve stenosis. Tricuspid Valve: The  tricuspid valve is not well visualized. Tricuspid valve regurgitation is mild. Aortic Valve: The aortic valve is tricuspid. There is mild calcification of the aortic valve. There is mild thickening of the aortic valve. Aortic valve regurgitation is not visualized. Aortic valve sclerosis/calcification is present, without any evidence of aortic stenosis. Aortic valve peak gradient measures 8.4 mmHg. Pulmonic Valve: The pulmonic valve was not well visualized. Pulmonic valve regurgitation is trivial. No evidence of pulmonic stenosis. Aorta: The aortic root is normal in size and structure. There is mild dilatation of the ascending aorta, measuring 40 mm. Pulmonary Artery: The pulmonary artery is not well seen. Venous: The inferior vena cava is normal in size with less than 50% respiratory variability, suggesting right atrial pressure of 8 mmHg. IAS/Shunts: No atrial level shunt detected by color flow Doppler.  LEFT VENTRICLE PLAX 2D LVIDd:         2.80 cm   Diastology LVIDs:         2.10 cm   LV e' medial:    5.55 cm/s LV PW:         1.10 cm   LV E/e' medial:  11.3 LV IVS:        1.20 cm   LV e' lateral:   6.42 cm/s LVOT diam:     2.20 cm   LV E/e' lateral: 9.8 LV SV:         52 LV SV Index:   32 LVOT Area:     3.80 cm  RIGHT VENTRICLE            IVC RV S prime:     6.20 cm/s  IVC diam: 1.70 cm LEFT ATRIUM         Index LA diam:    2.20 cm 1.38 cm/m  AORTIC VALVE AV Area (Vmax): 1.96 cm AV Vmax:        145.00 cm/s AV Peak Grad:   8.4 mmHg LVOT Vmax:      74.90 cm/s LVOT Vmean:     49.800 cm/s LVOT VTI:       0.136 m  AORTA Ao Root diam: 3.10 cm Ao Asc diam:  4.00 cm MITRAL VALVE                TRICUSPID VALVE MV Area (PHT): 2.48 cm     TR Peak grad:   24.6 mmHg MV Decel Time: 306 msec     TR Vmax:        248.00 cm/s MR Peak grad: 12.0 mmHg MR Vmax:  173.00 cm/s   SHUNTS MV E velocity: 62.90 cm/s   Systemic VTI:  0.14 m MV A velocity: 106.00 cm/s  Systemic Diam: 2.20 cm MV E/A ratio:  0.59 Lonni End MD  Electronically signed by Lonni Hanson MD Signature Date/Time: 02/18/2024/6:27:03 PM    Final    US  Carotid Bilateral Result Date: 02/18/2024 CLINICAL DATA:  Transient ischemic attack EXAM: BILATERAL CAROTID DUPLEX ULTRASOUND TECHNIQUE: Elnor scale imaging, color Doppler and duplex ultrasound were performed of bilateral carotid and vertebral arteries in the neck. COMPARISON:  None Available. FINDINGS: Criteria: Quantification of carotid stenosis is based on velocity parameters that correlate the residual internal carotid diameter with NASCET-based stenosis levels, using the diameter of the distal internal carotid lumen as the denominator for stenosis measurement. The following velocity measurements were obtained: RIGHT ICA: 39/11 cm/sec CCA: 53/11 cm/sec SYSTOLIC ICA/CCA RATIO:  0.7 ECA:  26 cm/sec LEFT ICA: 71 over 7 cm/sec CCA: 65/17 cm/sec SYSTOLIC ICA/CCA RATIO:  1.1 ECA:  35 cm/sec RIGHT CAROTID ARTERY: Trace mild heterogeneous atherosclerotic plaque without evidence of stenosis. RIGHT VERTEBRAL ARTERY:  Patent with normal antegrade flow. LEFT CAROTID ARTERY: Trace heterogeneous atherosclerotic plaque without evidence of stenosis. LEFT VERTEBRAL ARTERY:  Patent with normal antegrade flow. IMPRESSION: 1. Trace atherosclerotic plaque in the bilateral internal carotid arteries without evidence of associated stenosis. 2. Vertebral arteries are patent with normal antegrade flow. Electronically Signed   By: Wilkie Lent M.D.   On: 02/18/2024 11:46   MR HIP RIGHT WO CONTRAST Result Date: 02/18/2024 CLINICAL DATA:  Right hip pain and weakness after falling. Clinical concern or fracture. EXAM: MR OF THE RIGHT HIP WITHOUT CONTRAST TECHNIQUE: Multiplanar, multisequence MR imaging was performed. No intravenous contrast was administered. COMPARISON:  Radiographs 02/18/2024 FINDINGS: Bones: Patient is status post bilateral total hip arthroplasty. There is associated moderate susceptibility artifact. Allowing for this  artifact, no evidence of acute fracture or dislocation. The visualized bony pelvis appears normal. The visualized sacroiliac joints and symphysis pubis appear normal. Joint or bursal effusion Joint effusion: No significant hip joint effusion. Bursae: There is prominent soft tissue edema surrounding the proximal right femur, further described below. No focal periarticular fluid collection identified. Muscles and tendons Muscles and tendons: There is a large amount of edema within and surrounding the right iliopsoas muscle and tendon. The tendon appears completely avulsed from the lesser trochanter with surrounding ill-defined fluid. There is lesser edema within the proximal right thigh adductor musculature. The left iliopsoas, bilateral common hamstring and gluteus tendons appear intact. There is mild gluteus tendinosis bilaterally. Other findings Miscellaneous: Prominent subcutaneous fat within the left groin, measuring up to 2.9 cm on coronal image 32/21, favored to reflect a lipoma over a fat containing hernia. 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 IMPRESSION: 1. Complete avulsion of the right iliopsoas tendon from the lesser trochanter with surrounding soft tissue edema. 2. No evidence of acute fracture or dislocation. 3. Status post bilateral total hip arthroplasty. 4. Prominent subcutaneous fat within the left groin, favored to reflect a lipoma over a fat containing hernia. Correlate clinically. Electronically Signed   By: Elsie Perone M.D.   On: 02/18/2024 08:44   MR BRAIN WO CONTRAST Result Date: 02/18/2024 CLINICAL DATA:  89 year old female with altered mental status. Weakness. Dizziness. EXAM: MRI HEAD WITHOUT CONTRAST TECHNIQUE: Multiplanar, multiecho pulse sequences of the brain and surrounding structures were obtained without intravenous contrast. COMPARISON:  Head CT 01/18/2023 and earlier. Cervical spine CT 06/18/2022. FINDINGS: Brain: No restricted diffusion to suggest  acute  infarction. No midline shift, mass effect, evidence of mass lesion, ventriculomegaly, extra-axial collection or acute intracranial hemorrhage. 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 Stable cerebral volume since last year. Metal hardware susceptibility artifact at the craniocervical junction. Patchy and confluent bilateral cerebral white matter T2 and FLAIR hyperintensity. Scattered small chronic infarcts in the bilateral cerebellum. Comparatively mild T2 heterogeneity in the pons. Mild T2 and FLAIR heterogeneity in the deep gray nuclei. No cortical encephalomalacia identified. No definite chronic cerebral blood products on SWI. Vascular: Major intracranial vascular flow voids are preserved. Some generalized intracranial artery tortuosity, and dominant appearing distal left vertebral artery. Skull and upper cervical spine: Chronically abnormal craniocervical junction with posterior fusion hardware, left suboccipital bone screw contacting only a small portion of the left C1 ring as demonstrated by CT last year. But there has been evidence of multilevel upper cervical arthrodesis. Underlying chronic displaced odontoid fracture. And subsequent multifactorial stenosis at the cervicomedullary junction (series 7, image 12). No lower brainstem encephalomalacia is evident. And grossly maintained signal and volume of the visible cervical spinal cord. Normal background bone marrow signal. Sinuses/Orbits: Postoperative changes to the globes. Mild to moderate left maxillary sinus mucosal thickening. Other paranasal sinuses and mastoids are well aerated. Other: Grossly normal visible internal auditory structures. Chronic postoperative changes to the suboccipital region, posterior neck. IMPRESSION: 1. No acute intracranial abnormality. 2. Chronic small vessel disease, most pronounced in the bilateral cerebellum and cerebral white matter. 3. Advanced chronic posttraumatic and postoperative deformity at the craniocervical junction, subsequent chronic  cervicomedullary junction stenosis. Electronically Signed   By: VEAR Hurst M.D.   On: 02/18/2024 07:25   DG Hip Unilat W or Wo Pelvis 2-3 Views Right Result Date: 02/18/2024 EXAM: 2 or 3 VIEW(S) XRAY OF THE PELVIS AND RIGHT HIP 02/18/2024 05:50:43 AM COMPARISON: None available. CLINICAL HISTORY: BIB [facility] from [facility] with CC of R hip pain. Upon assessing pt, answers are not consistent. Pt is able to make conversation flow but content is often not relevant to CC. At this time pt is oriented to person and place only. FINDINGS: JOINTS: The patient is status post bilateral total hip arthroplasty. No signs of periprosthetic fracture or dislocation. SOFT TISSUES: Moderate distention of the bowel loops with gas and stool. Lateral extension of the left lower quadrant bowel loops beyond the margins of the left iliac bone may reflect underlying hernia. VASCULATURE: Vascular calcifications noted. DISCS/DEGENERATIVE CHANGES: Degenerative changes noted within the imaged portions of the lumbar spine. IMPRESSION: 1. No acute abnormality of the right hip. 2. Status post bilateral total hip arthroplasty without signs of periprosthetic fracture or dislocation. 3. Moderate distention of the bowel loops with gas and stool. Lateral extension of the left lower quadrant bowel loops beyond the margins of the left iliac bone may reflect underlying hernia. Electronically signed by: Waddell Calk MD 02/18/2024 06:18 AM EDT RP Workstation: [id number]    Assessment/Plan Right iliopsoas tendon avulsion with right lower extremity weakness Right iliopsoas tendon avulsion confirmed by MRI, resulting in right lower extremity weakness. Pain is moderately controlled with Tylenol . Currently in a skilled nursing facility for rehabilitation. - Advise weight bearing as tolerated - Continue rehabilitation in skilled nursing facility - Follow up with orthopedics in one month  Dementia with current disorientation Dementia with  disorientation to time and place, exhibiting confusion about current location and recent events.  Chronic kidney disease Chronic kidney disease with worsening GFR. Adjusted metformin  dosage due to renal function. - Continue metformin  at adjusted lower dose  Type 2 diabetes mellitus Type 2 diabetes mellitus managed with metformin . Daily glucose checks are in place. - Continue metformin  1000 mg daily - Perform daily glucose checks  Depression Depression managed with Abilify . - Continue Abilify  5 mg daily  Chronic dizziness Chronic dizziness managed with Antivert  as needed. - Continue Antivert  as needed for dizziness  Hyperlipidemia Hyperlipidemia managed with Zetia . - Continue Zetia  10 mg daily   Family/ staff Communication: nursing  Labs/tests ordered: cbc, cmp

## 2024-02-24 LAB — BASIC METABOLIC PANEL WITH GFR
BUN: 27 — AB (ref 4–21)
CO2: 32 — AB (ref 13–22)
Chloride: 104 (ref 99–108)
Creatinine: 0.9 (ref 0.5–1.1)
Glucose: 119
Potassium: 4 meq/L (ref 3.5–5.1)
Sodium: 141 (ref 137–147)

## 2024-02-24 LAB — CBC AND DIFFERENTIAL
HCT: 36 (ref 36–46)
Hemoglobin: 11.2 — AB (ref 12.0–16.0)
Neutrophils Absolute: 3855
Platelets: 244 K/uL (ref 150–400)
WBC: 7.5

## 2024-02-24 LAB — COMPREHENSIVE METABOLIC PANEL WITH GFR
Calcium: 8.8 (ref 8.7–10.7)
eGFR: 63

## 2024-02-24 LAB — CBC: RBC: 3.78 — AB (ref 3.87–5.11)

## 2024-02-25 ENCOUNTER — Encounter: Payer: Self-pay | Admitting: Student

## 2024-03-24 ENCOUNTER — Encounter: Payer: Self-pay | Admitting: Orthopedic Surgery

## 2024-03-24 ENCOUNTER — Non-Acute Institutional Stay (SKILLED_NURSING_FACILITY): Payer: MEDICARE | Admitting: Orthopedic Surgery

## 2024-03-24 DIAGNOSIS — N182 Chronic kidney disease, stage 2 (mild): Secondary | ICD-10-CM

## 2024-03-24 DIAGNOSIS — F02A3 Dementia in other diseases classified elsewhere, mild, with mood disturbance: Secondary | ICD-10-CM

## 2024-03-24 DIAGNOSIS — I10 Essential (primary) hypertension: Secondary | ICD-10-CM | POA: Diagnosis not present

## 2024-03-24 DIAGNOSIS — E1169 Type 2 diabetes mellitus with other specified complication: Secondary | ICD-10-CM

## 2024-03-24 DIAGNOSIS — F333 Major depressive disorder, recurrent, severe with psychotic symptoms: Secondary | ICD-10-CM

## 2024-03-24 DIAGNOSIS — S76811D Strain of other specified muscles, fascia and tendons at thigh level, right thigh, subsequent encounter: Secondary | ICD-10-CM

## 2024-03-24 DIAGNOSIS — E1165 Type 2 diabetes mellitus with hyperglycemia: Secondary | ICD-10-CM

## 2024-03-24 DIAGNOSIS — E785 Hyperlipidemia, unspecified: Secondary | ICD-10-CM

## 2024-03-24 DIAGNOSIS — G301 Alzheimer's disease with late onset: Secondary | ICD-10-CM

## 2024-03-24 DIAGNOSIS — R600 Localized edema: Secondary | ICD-10-CM

## 2024-03-24 DIAGNOSIS — R42 Dizziness and giddiness: Secondary | ICD-10-CM

## 2024-03-24 NOTE — Progress Notes (Signed)
 Location:  Other [facility]) Nursing Home Room Number: 103-A Place of Service:  SNF ([phone]) Provider:  Greig CHARLENA Cluster, NP   Patient Care Team: Abdul Fine, MD as PCP - General (Family Medicine) Pa, [facility] (Optometry)  Extended Emergency Contact Information Primary Emergency Contact: Jude Sotero RAMAN Address: [address]          [place], [place] [address] [place] Home Phone: [phone] Mobile Phone: [phone] Relation: Daughter  Code Status:  DNR Goals of care: Advanced Directive information    03/24/2024   11:12 AM  Advanced Directives  Does Patient Have a Medical Advance Directive? Yes  Type of Advance Directive Out of facility DNR (pink MOST or yellow form)  Does patient want to make changes to medical advance directive? No - Patient declined     Chief Complaint  Patient presents with   Medical Management of Chronic Issues    Routine visit. Discuss need for AWV, td/tdap, eye exam, flu vaccine, covid booster, and DEXA     HPI:  Pt is a 89 y.o. female seen today for medical management of chronic diseases.    Patient currently resides on the skilled nursing unit at [facility]. PMH: HTN, HLD, T2DM, thyroid  nodule, meniere's disease, chronic back pain s/p cervical spinal fusion, and multiple orthopedic surgeries including left arthroplasty/ I & D and revision.   HTN- BUN/creat 27/0.9 02/24/2024, not on medication  HLD- total 116, LDL 02/19/2024, allergy to statins, remains on Zetia  AD- 02/2024 MRI brain noted chronic small vessel disease, no behaviors, ambulates with walker, not on medication T2DM- A1c 7.1 01/06/2024, urine microalbumin> normal 01/06/2024, last eye exam unknown, remains on metformin  Dizziness- remains on meclizine  CKD- GFR 63 (09/15)> was 57 (04/21) Depression with psychosis- no changes in mood, remains on Abilify  Lower leg edema- not on medication  Right iliopsoas tendon avulsion- 09/09 confirmed by MRI, pain  controlled, WBAT, pain controlled with tylenol , working with PT   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   Diabetes mellitus without complication (HCC)    metformin -oral meds   HOH (hard of hearing)    hearing aids/ bilateral   Hypercholesteremia    Hypertension    moderate/ controlled on meds   Meniere syndrome    PONV (postoperative nausea and vomiting)    difficulty waking up   Past Surgical History:  Procedure Laterality Date   ABDOMINAL HYSTERECTOMY     CATARACT EXTRACTION     CATARACT EXTRACTION W/PHACO Right 09/19/2016   Procedure: CATARACT EXTRACTION PHACO AND INTRAOCULAR LENS PLACEMENT (IOC) Diabetic  Right eye;  Surgeon: Dene Etienne, MD;  Location: [facility];  Service: Ophthalmology;  Laterality: Right;  Diabetic-oral med   CERVICAL FUSION     for fracture C1 and C2   CHOLECYSTECTOMY     ESOPHAGOGASTRODUODENOSCOPY (EGD) WITH PROPOFOL  N/A 05/28/2017   Procedure: ESOPHAGOGASTRODUODENOSCOPY (EGD) WITH PROPOFOL ;  Surgeon: Toledo, Ladell POUR, MD;  Location: [facility] ENDOSCOPY;  Service: Gastroenterology;  Laterality: N/A;   JOINT REPLACEMENT Bilateral    knee and hip   KNEE ARTHROSCOPY     multiple   Morton's neuroma removed from foot     ROTATOR CUFF REPAIR Right    TONSILLECTOMY      Allergies  Allergen Reactions   Amlodipine Swelling    Lower leg edema   Tramadol Nausea Only   Codeine Nausea And Vomiting   Fentanyl  Nausea And Vomiting   Hydromorphone Hcl Nausea  And Vomiting   Morphine Itching and Nausea And Vomiting    Other reaction(s): Vomiting/ feels like ants under skin   Nucynta [Tapentadol] Other (See Comments)    hallucinations   Statins     Muscle pain    Tegaderm Ag Mesh [Silver] Other (See Comments)    blisters   Tetracaine Itching   Tetracyclines & Related Rash    Outpatient Encounter Medications as of 03/24/2024  Medication Sig   Acetaminophen  325 MG CAPS Take 650 mg by mouth as needed (for  pain).   ARIPiprazole  (ABILIFY ) 5 MG tablet Take 1 tablet (5 mg total) by mouth at bedtime.   ezetimibe  (ZETIA ) 10 MG tablet Take 1 tablet (10 mg total) by mouth daily.   ipratropium (ATROVENT ) 0.03 % nasal spray Place 2 sprays into both nostrils 2 (two) times daily.   latanoprost  (XALATAN ) 0.005 % ophthalmic solution Place 1 drop into both eyes at bedtime. 1 drop per eye hour of sleep   meclizine  (ANTIVERT ) 25 MG tablet Take 1 tablet (25 mg total) by mouth 3 (three) times daily as needed for dizziness.   metformin  (FORTAMET ) 1000 MG (OSM) 24 hr tablet Take 1 tablet (1,000 mg total) by mouth daily with breakfast.   Multiple Vitamins-Minerals (CENTRUM SILVER 50+WOMEN PO) Take 1 tablet by mouth daily. am   vitamin B-12 (CYANOCOBALAMIN) 500 MCG tablet Take 1 tablet (500 mcg total) by mouth daily. dinner   glucose blood test strip Use to test blood sugar twice daily. Dx:E11.65   triamcinolone cream (KENALOG) 0.1 % Apply 1 application topically as needed. Apply externally to the affected area twice daily (Patient not taking: Reported on 03/24/2024)   No facility-administered encounter medications on file as of 03/24/2024.    Review of Systems  Unable to perform ROS: Dementia    Immunization History  Administered Date(s) Administered   INFLUENZA, HIGH DOSE SEASONAL PF 03/16/2015, 04/15/2018, 08/07/2018   Influenza-Unspecified 02/21/2012, 03/26/2013, 02/09/2014, 03/16/2015, 03/29/2016, 04/06/2017, 04/08/2017, 03/11/2022, 04/04/2023   Moderna Covid-19 Fall Seasonal Vaccine 46yrs & older 08/22/2022   Moderna Covid-19 Vaccine Bivalent Booster 69yrs & up 11/07/2021   Moderna Sars-Covid-2 Vaccination 06/25/2019, 07/23/2019, 04/26/2020, 10/31/2020, 03/08/2023   Pneumococcal Conjugate-13 08/10/2013   Pneumococcal Polysaccharide-23 02/24/2014   Tdap 05/12/2010   Unspecified SARS-COV-2 Vaccination 09/20/2023   Zoster Recombinant(Shingrix) 09/06/2021, 05/11/2022   Pertinent  Health Maintenance Due   Topic Date Due   DEXA SCAN  Never done   OPHTHALMOLOGY EXAM  10/18/2023   Influenza Vaccine  01/10/2024   FOOT EXAM  03/26/2024   HEMOGLOBIN A1C  07/08/2024      06/18/2022    4:47 PM 12/21/2022    1:09 PM 06/28/2023    1:38 PM 01/03/2024   11:23 AM  Fall Risk  Falls in the past year?  0 0 0  Was there an injury with Fall?  0 0 0  Fall Risk Category Calculator  0 0 0  (RETIRED) Patient Fall Risk Level Low fall risk      Patient at Risk for Falls Due to    Impaired balance/gait  Fall risk Follow up    Falls evaluation completed     Data saved with a previous flowsheet row definition   Functional Status Survey:    Vitals:   03/24/24 1044  BP: 108/71  Pulse: 89  Weight: 134 lb (60.8 kg)  Height: 5' 2 (1.575 m)   Body mass index is 24.51 kg/m. Physical Exam Vitals reviewed.  Constitutional:  General: She is not in acute distress. HENT:     Head: Normocephalic.  Eyes:     General:        Right eye: No discharge.        Left eye: No discharge.  Cardiovascular:     Rate and Rhythm: Normal rate and regular rhythm.     Pulses: Normal pulses.     Heart sounds: Normal heart sounds.  Pulmonary:     Effort: Pulmonary effort is normal.     Breath sounds: Normal breath sounds.  Abdominal:     General: Bowel sounds are normal. There is no distension.     Palpations: Abdomen is soft.     Tenderness: There is no abdominal tenderness.  Musculoskeletal:     Cervical back: Neck supple.     Right lower leg: Edema present.     Left lower leg: Edema present.     Comments: BLE non pitting  Skin:    General: Skin is warm.     Capillary Refill: Capillary refill takes less than 2 seconds.  Neurological:     General: No focal deficit present.     Mental Status: She is alert. Mental status is at baseline.     Gait: Gait abnormal.  Psychiatric:        Mood and Affect: Mood normal.     Labs reviewed: Recent Labs    01/06/24 0849 02/18/24 0237 02/19/24 0514  02/24/24 0000  NA 140 140 140 141  K 3.9 4.4 3.7 4.0  CL 102 105 106 104  CO2 29 26 25  32*  GLUCOSE 171* 217* 192*  --   BUN 20 26* 22 27*  CREATININE 1.05* 0.92 1.00 0.9  CALCIUM 9.8 8.7* 8.6* 8.8  MG  --   --  1.5*  --    Recent Labs    01/06/24 0849 02/18/24 0237 02/19/24 0514  AST 15 23 16   ALT 10 8 10   ALKPHOS  --  50 55  BILITOT 0.5 1.4* 0.6  PROT 6.7 6.2* 6.0*  ALBUMIN  --  3.5 3.4*   Recent Labs    01/06/24 0849 02/18/24 0237 02/19/24 0514 02/24/24 0000  WBC 7.6 7.8 10.1 7.5  NEUTROABS 4,043 4.5  --  3,855.00  HGB 13.0 11.0* 11.0* 11.2*  HCT 40.9 34.4* 34.1* 36  MCV 92.3 91.7 90.7  --   PLT 206 193 189 244   Lab Results  Component Value Date   TSH 2.848 02/18/2024   Lab Results  Component Value Date   HGBA1C 7.1 (H) 01/06/2024   Lab Results  Component Value Date   CHOL 116 02/19/2024   HDL 42 02/19/2024   LDLCALC 58 02/19/2024   TRIG 80 02/19/2024   CHOLHDL 2.8 02/19/2024    Significant Diagnostic Results in last 30 days:  No results found.  Assessment/Plan 1. Hypertension, essential (Primary) - controlled, goal< 150/90 - not on medication  2. Hyperlipidemia associated with type 2 diabetes mellitus (HCC) - total 116, LDL 58 02/19/2024 - statin allergy - on Zetia , consider stopped due to age  16. Mild late onset Alzheimer's dementia with mood disturbance (HCC) - no behaviors - weight stable - ambulates with walker - dependent with ADLs except feeding - cont skilled nursing  4. Type 2 diabetes mellitus with hyperglycemia, without long-term current use of insulin  (HCC) - A1c 7.1 01/06/2024 - no hypoglycemia - cont metformin   5. Dizziness - cont meclizine   6. Stage 2 chronic kidney disease - encourage  hydration with water - avoid NSAIDS  7. Depression, major, recurrent, severe with psychosis (HCC) - mood stable - cont Abilify   8. Lower leg edema - non pitting - not on medication - cont leg elevation and teds if  tolerated  9. Strain of right iliopsoas muscle, subsequent encounter - confirmed with MRI 09/09 - WBAT - cont PT - cont tylenol  for pain    Family/ staff Communication: plan discussed with patient and nurse  Labs/tests ordered:  none

## 2024-04-03 ENCOUNTER — Encounter: Payer: MEDICARE | Admitting: Student

## 2024-04-06 ENCOUNTER — Non-Acute Institutional Stay (SKILLED_NURSING_FACILITY): Payer: Self-pay | Admitting: Orthopedic Surgery

## 2024-04-06 ENCOUNTER — Encounter: Payer: Self-pay | Admitting: Orthopedic Surgery

## 2024-04-06 DIAGNOSIS — R600 Localized edema: Secondary | ICD-10-CM

## 2024-04-06 DIAGNOSIS — S76811D Strain of other specified muscles, fascia and tendons at thigh level, right thigh, subsequent encounter: Secondary | ICD-10-CM | POA: Diagnosis not present

## 2024-04-06 DIAGNOSIS — I10 Essential (primary) hypertension: Secondary | ICD-10-CM | POA: Diagnosis not present

## 2024-04-06 DIAGNOSIS — F333 Major depressive disorder, recurrent, severe with psychotic symptoms: Secondary | ICD-10-CM

## 2024-04-06 DIAGNOSIS — G301 Alzheimer's disease with late onset: Secondary | ICD-10-CM | POA: Diagnosis not present

## 2024-04-06 DIAGNOSIS — E1165 Type 2 diabetes mellitus with hyperglycemia: Secondary | ICD-10-CM

## 2024-04-06 DIAGNOSIS — R42 Dizziness and giddiness: Secondary | ICD-10-CM

## 2024-04-06 DIAGNOSIS — E1169 Type 2 diabetes mellitus with other specified complication: Secondary | ICD-10-CM

## 2024-04-06 DIAGNOSIS — N182 Chronic kidney disease, stage 2 (mild): Secondary | ICD-10-CM

## 2024-04-06 DIAGNOSIS — F02A3 Dementia in other diseases classified elsewhere, mild, with mood disturbance: Secondary | ICD-10-CM

## 2024-04-06 DIAGNOSIS — E785 Hyperlipidemia, unspecified: Secondary | ICD-10-CM

## 2024-04-06 NOTE — Progress Notes (Signed)
 Location:  Other Nursing Home Room Number: 103/A Place of Service:    Provider:  Greig FORBES Cluster, NP   Abdul Fine, MD  Patient Care Team: Abdul Fine, MD as PCP - General (Family Medicine) Pa, [facility])  Extended Emergency Contact Information Primary Emergency Contact: Jude Sotero RAMAN Address: [address]          [place], [place] [address] [place] Home Phone: [phone] Mobile Phone: [phone] Relation: Daughter  Code Status:  DNR Goals of care: Advanced Directive information    03/24/2024   11:12 AM  Advanced Directives  Does Patient Have a Medical Advance Directive? Yes  Type of Advance Directive Out of facility DNR (pink MOST or yellow form)  Does patient want to make changes to medical advance directive? No - Patient declined     Chief Complaint  Patient presents with   Discharge Note    HPI:  Pt is a 89 y.o. female seen today for discharge evaluation.   Patient currently resides on the skilled nursing unit at [facility]. PMH: HTN, HLD, T2DM, thyroid  nodule, meniere's disease, chronic back pain s/p cervical spinal fusion, and multiple orthopedic surgeries including left arthroplasty/ I & D and revision.   Right iliopsoas tendon avulsion- 09/09 confirmed by MRI, pain controlled, WBAT, pain controlled with tylenol , working with PT HTN- BUN/creat 27/0.9 02/24/2024, not on medication  HLD- total 116, LDL 02/19/2024, allergy to statins, remains on Zetia  AD- 02/2024 MRI brain noted chronic small vessel disease, no behaviors, ambulates with walker, not on medication T2DM- A1c 7.1 01/06/2024, urine microalbumin> normal 01/06/2024, last eye exam unknown, remains on metformin  Dizziness- remains on meclizine  CKD- GFR 63 (09/15)> was 57 (04/21) Depression with psychosis- no changes in mood, remains on Abilify  Lower leg edema- not on medication   Hospitalized 09/09-09/11 due to weakness. MRI confirmed right hip does show  right iliopsoas tendon avulsion. Surgery was not recommended. Improved ambulation with PT services. WBAT with FWW. No recent falls or injuries. Denies pain to right hip. Appetite fair. 10/28 plans to move to [facility], [place] at [facility].   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   Diabetes mellitus without complication (HCC)    metformin -oral meds   HOH (hard of hearing)    hearing aids/ bilateral   Hypercholesteremia    Hypertension    moderate/ controlled on meds   Meniere syndrome    PONV (postoperative nausea and vomiting)    difficulty waking up   Past Surgical History:  Procedure Laterality Date   ABDOMINAL HYSTERECTOMY     CATARACT EXTRACTION     CATARACT EXTRACTION W/PHACO Right 09/19/2016   Procedure: CATARACT EXTRACTION PHACO AND INTRAOCULAR LENS PLACEMENT (IOC) Diabetic  Right eye;  Surgeon: Dene Etienne, MD;  Location: [facility];  Service: Ophthalmology;  Laterality: Right;  Diabetic-oral med   CERVICAL FUSION     for fracture C1 and C2   CHOLECYSTECTOMY     ESOPHAGOGASTRODUODENOSCOPY (EGD) WITH PROPOFOL  N/A 05/28/2017   Procedure: ESOPHAGOGASTRODUODENOSCOPY (EGD) WITH PROPOFOL ;  Surgeon: Toledo, Ladell POUR, MD;  Location: [facility] ENDOSCOPY;  Service: Gastroenterology;  Laterality: N/A;   JOINT REPLACEMENT Bilateral    knee and hip   KNEE ARTHROSCOPY     multiple   Morton's neuroma removed from foot     ROTATOR CUFF REPAIR Right    TONSILLECTOMY      Allergies  Allergen Reactions   Amlodipine Swelling  Lower leg edema   Tramadol Nausea Only   Codeine Nausea And Vomiting   Fentanyl  Nausea And Vomiting   Hydromorphone Hcl Nausea And Vomiting   Morphine Itching and Nausea And Vomiting    Other reaction(s): Vomiting/ feels like ants under skin   Nucynta [Tapentadol] Other (See Comments)    hallucinations   Statins     Muscle pain    Tegaderm Ag Mesh [Silver] Other (See Comments)    blisters   Tetracaine  Itching   Tetracyclines & Related Rash    Outpatient Encounter Medications as of 04/06/2024  Medication Sig   Acetaminophen  325 MG CAPS Take 650 mg by mouth as needed (for pain).   ARIPiprazole  (ABILIFY ) 5 MG tablet Take 1 tablet (5 mg total) by mouth at bedtime.   ezetimibe  (ZETIA ) 10 MG tablet Take 1 tablet (10 mg total) by mouth daily.   glucose blood test strip Use to test blood sugar twice daily. Dx:E11.65   ipratropium (ATROVENT ) 0.03 % nasal spray Place 2 sprays into both nostrils 2 (two) times daily.   latanoprost  (XALATAN ) 0.005 % ophthalmic solution Place 1 drop into both eyes at bedtime. 1 drop per eye hour of sleep   meclizine  (ANTIVERT ) 25 MG tablet Take 1 tablet (25 mg total) by mouth 3 (three) times daily as needed for dizziness.   metformin  (FORTAMET ) 1000 MG (OSM) 24 hr tablet Take 1 tablet (1,000 mg total) by mouth daily with breakfast.   Multiple Vitamins-Minerals (CENTRUM SILVER 50+WOMEN PO) Take 1 tablet by mouth daily. am   triamcinolone cream (KENALOG) 0.1 % Apply 1 application topically as needed. Apply externally to the affected area twice daily (Patient not taking: Reported on 03/24/2024)   vitamin B-12 (CYANOCOBALAMIN) 500 MCG tablet Take 1 tablet (500 mcg total) by mouth daily. dinner   No facility-administered encounter medications on file as of 04/06/2024.    Review of Systems  Unable to perform ROS: Dementia    Immunization History  Administered Date(s) Administered   INFLUENZA, HIGH DOSE SEASONAL PF 03/16/2015, 04/15/2018, 08/07/2018   Influenza-Unspecified 02/21/2012, 03/26/2013, 02/09/2014, 03/16/2015, 03/29/2016, 04/06/2017, 04/08/2017, 03/11/2022, 04/04/2023   Moderna Covid-19 Fall Seasonal Vaccine 78yrs & older 08/22/2022   Moderna Covid-19 Vaccine Bivalent Booster 20yrs & up 11/07/2021   Moderna Sars-Covid-2 Vaccination 06/25/2019, 07/23/2019, 04/26/2020, 10/31/2020, 03/08/2023   Pneumococcal Conjugate-13 08/10/2013   Pneumococcal  Polysaccharide-23 02/24/2014   Tdap 05/12/2010   Unspecified SARS-COV-2 Vaccination 09/20/2023   Zoster Recombinant(Shingrix) 09/06/2021, 05/11/2022   Pertinent  Health Maintenance Due  Topic Date Due   DEXA SCAN  Never done   OPHTHALMOLOGY EXAM  10/18/2023   Influenza Vaccine  01/10/2024   FOOT EXAM  03/26/2024   HEMOGLOBIN A1C  07/08/2024      06/18/2022    4:47 PM 12/21/2022    1:09 PM 06/28/2023    1:38 PM 01/03/2024   11:23 AM 03/27/2024   12:51 PM  Fall Risk  Falls in the past year?  0 0 0 1  Was there an injury with Fall?  0 0 0 0  Fall Risk Category Calculator  0 0 0 1  (RETIRED) Patient Fall Risk Level Low fall risk       Patient at Risk for Falls Due to    Impaired balance/gait History of fall(s);Impaired balance/gait  Fall risk Follow up    Falls evaluation completed Falls evaluation completed     Data saved with a previous flowsheet row definition   Functional Status Survey:    Vitals:  04/06/24 1159  BP: 115/74  Pulse: 67  Resp: 18  Temp: (!) 97.4 F (36.3 C)  SpO2: 99%  Weight: 134 lb 8 oz (61 kg)  Height: 5' 2 (1.575 m)   Body mass index is 24.6 kg/m. Physical Exam Vitals reviewed.  Constitutional:      General: She is not in acute distress. HENT:     Head: Normocephalic.  Eyes:     General:        Right eye: No discharge.        Left eye: No discharge.  Cardiovascular:     Rate and Rhythm: Normal rate and regular rhythm.     Pulses: Normal pulses.     Heart sounds: Normal heart sounds.  Pulmonary:     Effort: Pulmonary effort is normal.     Breath sounds: Normal breath sounds.  Abdominal:     General: Bowel sounds are normal. There is no distension.     Palpations: Abdomen is soft.     Tenderness: There is no abdominal tenderness.  Musculoskeletal:     Cervical back: Neck supple.     Right lower leg: Edema present.     Left lower leg: Edema present.     Comments: Non pitting  Skin:    General: Skin is warm.     Capillary Refill:  Capillary refill takes less than 2 seconds.  Neurological:     General: No focal deficit present.     Mental Status: She is alert. Mental status is at baseline.     Gait: Gait abnormal.  Psychiatric:        Mood and Affect: Mood normal.     Labs reviewed: Recent Labs    01/06/24 0849 02/18/24 0237 02/19/24 0514 02/24/24 0000  NA 140 140 140 141  K 3.9 4.4 3.7 4.0  CL 102 105 106 104  CO2 29 26 25  32*  GLUCOSE 171* 217* 192*  --   BUN 20 26* 22 27*  CREATININE 1.05* 0.92 1.00 0.9  CALCIUM 9.8 8.7* 8.6* 8.8  MG  --   --  1.5*  --    Recent Labs    01/06/24 0849 02/18/24 0237 02/19/24 0514  AST 15 23 16   ALT 10 8 10   ALKPHOS  --  50 55  BILITOT 0.5 1.4* 0.6  PROT 6.7 6.2* 6.0*  ALBUMIN  --  3.5 3.4*   Recent Labs    01/06/24 0849 02/18/24 0237 02/19/24 0514 02/24/24 0000  WBC 7.6 7.8 10.1 7.5  NEUTROABS 4,043 4.5  --  3,855.00  HGB 13.0 11.0* 11.0* 11.2*  HCT 40.9 34.4* 34.1* 36  MCV 92.3 91.7 90.7  --   PLT 206 193 189 244   Lab Results  Component Value Date   TSH 2.848 02/18/2024   Lab Results  Component Value Date   HGBA1C 7.1 (H) 01/06/2024   Lab Results  Component Value Date   CHOL 116 02/19/2024   HDL 42 02/19/2024   LDLCALC 58 02/19/2024   TRIG 80 02/19/2024   CHOLHDL 2.8 02/19/2024    Significant Diagnostic Results in last 30 days:  No results found.  Assessment/Plan 1. Strain of right iliopsoas muscle, subsequent encounter (Primary) - hospitalized 09/09-09/11 - confirmed with MRI 09/09 - WBAT - cont PT - cont tylenol  for pain - cont HHPT/OT in AL  2. Hypertension, essential - controlled without medication  3. Hyperlipidemia associated with type 2 diabetes mellitus (HCC) - LDL 58 02/19/2024 - cont Zetia   4. 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 Type 2 diabetes mellitus with hyperglycemia, without long-term  current use of insulin  (HCC) - A1c 7.1 01/06/2024 - no recent hypoglycemias - cont metformin   6. Dizziness - cont meclizine    7. Stage 2 chronic kidney disease - encourage hydration with water - avoid NSAIDS  8. Depression, major, recurrent, severe with psychosis (HCC) - no changes in mood - cont Abilify   9. Lower leg edema - not on medication - cont compression stockings    Family/ staff Communication: plan discussed with patient and nurse  Labs/tests ordered:  none

## 2024-04-30 LAB — OPHTHALMOLOGY REPORT-SCANNED

## 2024-05-01 ENCOUNTER — Encounter: Payer: Self-pay | Admitting: Orthopedic Surgery

## 2024-05-01 ENCOUNTER — Non-Acute Institutional Stay: Payer: MEDICARE | Admitting: Orthopedic Surgery

## 2024-05-01 DIAGNOSIS — Z7984 Long term (current) use of oral hypoglycemic drugs: Secondary | ICD-10-CM

## 2024-05-01 DIAGNOSIS — N1831 Chronic kidney disease, stage 3a: Secondary | ICD-10-CM

## 2024-05-01 DIAGNOSIS — E1165 Type 2 diabetes mellitus with hyperglycemia: Secondary | ICD-10-CM

## 2024-05-01 DIAGNOSIS — I10 Essential (primary) hypertension: Secondary | ICD-10-CM | POA: Diagnosis not present

## 2024-05-01 DIAGNOSIS — G301 Alzheimer's disease with late onset: Secondary | ICD-10-CM

## 2024-05-01 DIAGNOSIS — S76811D Strain of other specified muscles, fascia and tendons at thigh level, right thigh, subsequent encounter: Secondary | ICD-10-CM

## 2024-05-01 DIAGNOSIS — E1169 Type 2 diabetes mellitus with other specified complication: Secondary | ICD-10-CM | POA: Diagnosis not present

## 2024-05-01 DIAGNOSIS — E785 Hyperlipidemia, unspecified: Secondary | ICD-10-CM

## 2024-05-01 DIAGNOSIS — R600 Localized edema: Secondary | ICD-10-CM

## 2024-05-01 DIAGNOSIS — R42 Dizziness and giddiness: Secondary | ICD-10-CM

## 2024-05-01 DIAGNOSIS — F333 Major depressive disorder, recurrent, severe with psychotic symptoms: Secondary | ICD-10-CM

## 2024-05-01 DIAGNOSIS — F02A3 Dementia in other diseases classified elsewhere, mild, with mood disturbance: Secondary | ICD-10-CM

## 2024-05-01 NOTE — Progress Notes (Signed)
 Location:  Other [place].  Nursing Home Room Number: [place] [id number] Place of Service:  ALF [phone]) Provider:  Greig Cluster, NP  PCP: Laurence Locus, DO  Patient Care Team: Laurence Locus, DO as PCP - General (Internal Medicine) Pa, [facility])  Extended Emergency Contact Information Primary Emergency Contact: Jude Sotero RAMAN Address: [address]          [place], [place] [address] [place] Home Phone: [phone] Mobile Phone: [phone] Relation: Daughter  Code Status:  DNR Goals of care: Advanced Directive information    03/24/2024   11:12 AM  Advanced Directives  Does Patient Have a Medical Advance Directive? Yes  Type of Advance Directive Out of facility DNR (pink MOST or yellow form)  Does patient want to make changes to medical advance directive? No - Patient declined     Chief Complaint  Patient presents with   Medical Management of Chronic Issues    Medical Management of Chronic Issues.     HPI:  Pt is a 89 y.o. female seen today for medical management of chronic diseases.    Patient currently resides on the assisted living unit at [facility] PMH: HTN, HLD, T2DM, thyroid  nodule, meniere's disease, chronic back pain s/p cervical spinal fusion, and multiple orthopedic surgeries including left arthroplasty/ I & D and revision.   [place] from [place].    T2DM- A1c 7.8 (11/06)> was 7.1 (07/28), urine microalbumin> normal 01/06/2024, last eye exam unknown, she was followed by endocrinology but would like PSC to manage, remains on metformin  (recently increased) Right iliopsoas tendon avulsion- 09/09 confirmed by MRI, pain controlled, WBAT, pain controlled with tylenol , working with PT HTN- 25/1.2 04/16/2024, not on medication  HLD- total 116, LDL 02/19/2024, allergy to statins, remains on Zetia  AD- MOCA 16/30 04/21/2024, 02/2024 MRI brain noted chronic small vessel disease, no behaviors, ambulates with walker, not on  medication Dizziness- remains on meclizine  CKD- GFR 42 (11/06)> was 63 (09/15)> was 57 (04/21) Depression with psychosis- no changes in mood, adjusting to [place] move> denies increased depression or sadness, remains on Abilify  Lower leg edema- resolved, not on medication   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   Diabetes mellitus without complication (HCC)    metformin -oral meds   HOH (hard of hearing)    hearing aids/ bilateral   Hypercholesteremia    Hypertension    moderate/ controlled on meds   Meniere syndrome    PONV (postoperative nausea and vomiting)    difficulty waking up   Past Surgical History:  Procedure Laterality Date   ABDOMINAL HYSTERECTOMY     CATARACT EXTRACTION     CATARACT EXTRACTION W/PHACO Right 09/19/2016   Procedure: CATARACT EXTRACTION PHACO AND INTRAOCULAR LENS PLACEMENT (IOC) Diabetic  Right eye;  Surgeon: Dene Etienne, MD;  Location: [facility];  Service: Ophthalmology;  Laterality: Right;  Diabetic-oral med   CERVICAL FUSION     for fracture C1 and C2   CHOLECYSTECTOMY     ESOPHAGOGASTRODUODENOSCOPY (EGD) WITH PROPOFOL  N/A 05/28/2017   Procedure: ESOPHAGOGASTRODUODENOSCOPY (EGD) WITH PROPOFOL ;  Surgeon: Toledo, Ladell POUR, MD;  Location: [facility] ENDOSCOPY;  Service: Gastroenterology;  Laterality: N/A;   JOINT REPLACEMENT Bilateral    knee and hip   KNEE ARTHROSCOPY     multiple   Morton's neuroma removed from foot     ROTATOR CUFF REPAIR Right    TONSILLECTOMY      Allergies  Allergen Reactions   Amlodipine Swelling    Lower leg edema   Tramadol Nausea Only   Codeine Nausea And Vomiting   Fentanyl  Nausea And Vomiting   Hydromorphone Hcl Nausea And Vomiting   Morphine Itching and Nausea And Vomiting    Other reaction(s): Vomiting/ feels like ants under skin   Nucynta [Tapentadol] Other (See Comments)    hallucinations   Statins     Muscle pain    Tegaderm Ag Mesh [Silver] Other (See Comments)     blisters   Tetracaine Itching   Tetracyclines & Related Rash    Outpatient Encounter Medications as of 05/01/2024  Medication Sig   Acetaminophen  325 MG CAPS Take 650 mg by mouth every 4 (four) hours as needed (for pain).   aluminum-magnesium hydroxide 200-200 MG/5ML suspension Take 10 mLs by mouth every 4 (four) hours as needed for indigestion.   ARIPiprazole  (ABILIFY ) 5 MG tablet Take 1 tablet (5 mg total) by mouth at bedtime.   bismuth subsalicylate (PEPTO BISMOL) 262 MG/15ML suspension Take 30 mLs by mouth as needed.   carbamide peroxide (DEBROX) 6.5 % OTIC solution Place 5 drops into both ears as needed.   cetirizine (ZYRTEC) 5 MG tablet Take 5 mg by mouth daily as needed for allergies.   dextromethorphan-guaiFENesin (ROBITUSSIN-DM) 10-100 MG/5ML liquid Take 10 mLs by mouth every 4 (four) hours as needed for cough.   ezetimibe  (ZETIA ) 10 MG tablet Take 1 tablet (10 mg total) by mouth daily.   Glucose 15 GM/32ML GEL Take 1 packet by mouth as needed.   glucose blood test strip Use to test blood sugar twice daily. Dx:E11.65   ipratropium (ATROVENT ) 0.03 % nasal spray Place 2 sprays into both nostrils 2 (two) times daily.   latanoprost  (XALATAN ) 0.005 % ophthalmic solution Place 1 drop into both eyes at bedtime. 1 drop per eye hour of sleep   Magnesium Hydroxide (MILK OF MAGNESIA PO) Take 10 mLs by mouth as needed.   meclizine  (ANTIVERT ) 25 MG tablet Take 1 tablet (25 mg total) by mouth 3 (three) times daily as needed for dizziness.   metFORMIN  (GLUMETZA ) 500 MG (MOD) 24 hr tablet Take 1,500 mg by mouth daily.   Multiple Vitamins-Minerals (CENTRUM SILVER 50+WOMEN PO) Take 1 tablet by mouth daily. am   nystatin (MYCOSTATIN/NYSTOP) powder Apply 1 Application topically as needed.   ondansetron  (ZOFRAN ) 4 MG tablet Take 4 mg by mouth every 8 (eight) hours as needed for nausea or vomiting.   OXYGEN Inhale into the lungs. 2lpm as needed   vitamin B-12 (CYANOCOBALAMIN) 500 MCG tablet Take 1  tablet (500 mcg total) by mouth daily. dinner   metformin  (FORTAMET ) 1000 MG (OSM) 24 hr tablet Take 1 tablet (1,000 mg total) by mouth daily with breakfast. (Patient not taking: Reported on 05/01/2024)   triamcinolone cream (KENALOG) 0.1 % Apply 1 application topically as needed. Apply externally to the affected area twice daily (Patient not taking: Reported on 05/01/2024)   No facility-administered encounter medications on file as of 05/01/2024.    Review of Systems  Unable to perform ROS: Dementia    Immunization History  Administered Date(s) Administered   INFLUENZA, HIGH DOSE SEASONAL PF 03/16/2015, 04/15/2018, 08/07/2018   Influenza-Unspecified 02/21/2012, 03/26/2013, 02/09/2014, 03/16/2015, 03/29/2016, 04/06/2017, 04/08/2017, 03/11/2022, 04/04/2023, 03/24/2024   Moderna Covid-19 Fall Seasonal Vaccine 38yrs & older 08/22/2022   Moderna Covid-19 Vaccine Bivalent Booster 49yrs & up 11/07/2021   Moderna Sars-Covid-2 Vaccination 06/25/2019, 07/23/2019, 04/26/2020, 10/31/2020, 03/08/2023   Pneumococcal Conjugate-13 08/10/2013  Pneumococcal Polysaccharide-23 02/24/2014   Tdap 05/12/2010   Unspecified SARS-COV-2 Vaccination 09/20/2023   Zoster Recombinant(Shingrix) 09/06/2021, 05/11/2022   Pertinent  Health Maintenance Due  Topic Date Due   Bone Density Scan  Never done   OPHTHALMOLOGY EXAM  10/18/2023   FOOT EXAM  03/26/2024   HEMOGLOBIN A1C  07/08/2024   Influenza Vaccine  Completed      06/18/2022    4:47 PM 12/21/2022    1:09 PM 06/28/2023    1:38 PM 01/03/2024   11:23 AM 03/27/2024   12:51 PM  Fall Risk  Falls in the past year?  0 0 0 1  Was there an injury with Fall?  0 0 0 0  Fall Risk Category Calculator  0 0 0 1  (RETIRED) Patient Fall Risk Level Low fall risk       Patient at Risk for Falls Due to    Impaired balance/gait History of fall(s);Impaired balance/gait  Fall risk Follow up    Falls evaluation completed Falls evaluation completed     Data saved with a  previous flowsheet row definition   Functional Status Survey:    Vitals:   05/01/24 0811  BP: (!) 177/85  Pulse: 67  Resp: 18  Temp: 98 F (36.7 C)  SpO2: 99%  Weight: 135 lb (61.2 kg)  Height: 5' 2 (1.575 m)   Body mass index is 24.69 kg/m. Physical Exam Vitals reviewed.  Constitutional:      General: She is not in acute distress. HENT:     Head: Normocephalic.     Right Ear: There is no impacted cerumen.     Left Ear: There is no impacted cerumen.     Nose: Nose normal.     Mouth/Throat:     Mouth: Mucous membranes are moist.  Eyes:     General:        Right eye: No discharge.        Left eye: No discharge.  Cardiovascular:     Rate and Rhythm: Normal rate and regular rhythm.     Pulses: Normal pulses.     Heart sounds: Normal heart sounds.  Pulmonary:     Effort: Pulmonary effort is normal.     Breath sounds: Normal breath sounds.  Abdominal:     General: Bowel sounds are normal. There is no distension.     Palpations: Abdomen is soft.     Tenderness: There is no abdominal tenderness.  Musculoskeletal:     Cervical back: Neck supple.     Right lower leg: No edema.     Left lower leg: No edema.  Skin:    General: Skin is warm.     Capillary Refill: Capillary refill takes less than 2 seconds.  Neurological:     General: No focal deficit present.     Mental Status: She is alert. Mental status is at baseline.     Gait: Gait abnormal.  Psychiatric:        Mood and Affect: Mood normal.     Comments: Alert to self/person/place, follows commands     Labs reviewed: Recent Labs    01/06/24 0849 02/18/24 0237 02/19/24 0514 02/24/24 0000  NA 140 140 140 141  K 3.9 4.4 3.7 4.0  CL 102 105 106 104  CO2 29 26 25  32*  GLUCOSE 171* 217* 192*  --   BUN 20 26* 22 27*  CREATININE 1.05* 0.92 1.00 0.9  CALCIUM 9.8 8.7* 8.6* 8.8  MG  --   --  1.5*  --    Recent Labs    01/06/24 0849 02/18/24 0237 02/19/24 0514  AST 15 23 16   ALT 10 8 10   ALKPHOS  --   50 55  BILITOT 0.5 1.4* 0.6  PROT 6.7 6.2* 6.0*  ALBUMIN  --  3.5 3.4*   Recent Labs    01/06/24 0849 02/18/24 0237 02/19/24 0514 02/24/24 0000  WBC 7.6 7.8 10.1 7.5  NEUTROABS 4,043 4.5  --  3,855.00  HGB 13.0 11.0* 11.0* 11.2*  HCT 40.9 34.4* 34.1* 36  MCV 92.3 91.7 90.7  --   PLT 206 193 189 244   Lab Results  Component Value Date   TSH 2.848 02/18/2024   Lab Results  Component Value Date   HGBA1C 7.1 (H) 01/06/2024   Lab Results  Component Value Date   CHOL 116 02/19/2024   HDL 42 02/19/2024   LDLCALC 58 02/19/2024   TRIG 80 02/19/2024   CHOLHDL 2.8 02/19/2024    Significant Diagnostic Results in last 30 days:  No results found.  Assessment/Plan 1. Type 2 diabetes mellitus with hyperglycemia, without long-term current use of insulin  (HCC) - A1c 7.8> was 7.1 - metformin  increased by endocrinology> family wants PSC to manage - recommend blood sugars QAM x 14 days, then weekly  - recheck A1c in 3 months   2. Strain of right iliopsoas muscle, subsequent encounter (Primary) - denies increased pain - no recent falls - WBAT with walker  3. Hypertension, essential - controlled without medication  4. Hyperlipidemia associated with type 2 diabetes mellitus (HCC) - LDL 58 02/19/2024 - cont Zetia   5. Mild late onset Alzheimer's dementia with mood disturbance (HCC) - recent [facility] 16/30 - no agitation - needs assist with some ADLs - weight stable - WBAT with walker - cont Abilify   6. Dizziness - cont meclizine   7. Stage 3a chronic kidney disease (HCC) - encourage hydration with water - avoid NSAIDS  8. Depression, major, recurrent, severe with psychosis (HCC) - stable mood - denies depression after recent move to [place] - cont Abilify   9. Lower leg edema - no edema - cont compression stockings  Family/ staff Communication: plan discussed with patient and nurse  Labs/tests ordered:  A1c in 3 months

## 2024-05-05 ENCOUNTER — Non-Acute Institutional Stay: Payer: MEDICARE | Admitting: Nurse Practitioner

## 2024-05-05 ENCOUNTER — Encounter: Payer: Self-pay | Admitting: Nurse Practitioner

## 2024-05-05 DIAGNOSIS — Z0000 Encounter for general adult medical examination without abnormal findings: Secondary | ICD-10-CM

## 2024-05-05 NOTE — Progress Notes (Signed)
 Chief Complaint  Patient presents with   Medicare Wellness    AWV     Subjective:   Kristin Deleon is a 89 y.o. female who presents for a Medicare Annual Wellness Visit.  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   Diabetes mellitus without complication (HCC)    metformin -oral meds   HOH (hard of hearing)    hearing aids/ bilateral   Hypercholesteremia    Hypertension    moderate/ controlled on meds   Meniere syndrome    PONV (postoperative nausea and vomiting)    difficulty waking up   Past Surgical History:  Procedure Laterality Date   ABDOMINAL HYSTERECTOMY     CATARACT EXTRACTION     CATARACT EXTRACTION W/PHACO Right 09/19/2016   Procedure: CATARACT EXTRACTION PHACO AND INTRAOCULAR LENS PLACEMENT (IOC) Diabetic  Right eye;  Surgeon: Dene Etienne, MD;  Location: [facility];  Service: Ophthalmology;  Laterality: Right;  Diabetic-oral med   CERVICAL FUSION     for fracture C1 and C2   CHOLECYSTECTOMY     ESOPHAGOGASTRODUODENOSCOPY (EGD) WITH PROPOFOL  N/A 05/28/2017   Procedure: ESOPHAGOGASTRODUODENOSCOPY (EGD) WITH PROPOFOL ;  Surgeon: Toledo, Ladell POUR, MD;  Location: [facility];  Service: Gastroenterology;  Laterality: N/A;   JOINT REPLACEMENT Bilateral    knee and hip   KNEE ARTHROSCOPY     multiple   Morton's neuroma removed from foot     ROTATOR CUFF REPAIR Right    TONSILLECTOMY     Family History  Problem Relation Age of Onset   Hypertension Sister    Diabetes Sister    Thyroid  disease Sister    Bipolar disorder Son    Diabetes Maternal Grandmother    Social History   Occupational History   Not on file  Tobacco Use   Smoking status: Never   Smokeless tobacco: Never  Vaping Use   Vaping status: Never  Used  Substance and Sexual Activity   Alcohol use: Yes    Alcohol/week: 2.0 standard drinks of alcohol    Types: 2 Glasses of wine per week   Drug use: No   Sexual activity: Not on file   Tobacco Counseling Counseling given: Not Answered  SDOH Screenings   Food Insecurity: No Food Insecurity (02/18/2024)  Housing: Low Risk  (02/18/2024)  Transportation Needs: No Transportation Needs (02/18/2024)  Utilities: Not At Risk (02/18/2024)  Alcohol Screen: Low Risk  (06/27/2023)  Depression (PHQ2-9): Low Risk  (05/05/2024)  Financial Resource Strain: Low Risk  (06/27/2023)  Physical Activity: Insufficiently Active (06/27/2023)  Social Connections: Moderately Isolated (02/18/2024)  Stress: Stress Concern Present (06/27/2023)  Tobacco Use: Low Risk  (05/05/2024)   See flowsheets for full screening details  Depression Screen PHQ 2 & 9 Depression Scale- Over the past 2 weeks, how often have you been bothered by any of the following problems? Little interest or pleasure in doing things: 0 Feeling down, depressed, or hopeless (PHQ Adolescent also includes...irritable): 0 PHQ-2 Total Score: 0 Trouble falling or staying asleep, or sleeping too much: 0 Feeling tired or having little energy: 0 Poor appetite or overeating (PHQ Adolescent also includes...weight loss): 0 Feeling bad about yourself - or that you are a failure or have let yourself or your family down: 2 Trouble concentrating on things, such as reading the newspaper or watching  television (PHQ Adolescent also includes...like school work): 2 Moving or speaking so slowly that other people could have noticed. Or the opposite - being so fidgety or restless that you have been moving around a lot more than usual: 2 Thoughts that you would be better off dead, or of hurting yourself in some way: 0 PHQ-9 Total Score: 6 If you checked off any problems, how difficult have these problems made it for you to do your work, take care of things at home, or get  along with other people?: Somewhat difficult  Depression Treatment Depression Interventions/Treatment : Medication     Goals Addressed   None    Visit info / Clinical Intake: Medicare Wellness Visit Type:: Subsequent Annual Wellness Visit Persons participating in visit:: patient Medicare Wellness Visit Mode:: In-person (required for WTM) Information given by:: patient Interpreter Needed?: No Pre-visit prep was completed: no AWV questionnaire completed by patient prior to visit?: no Living arrangements:: in retirement community Patient's Overall Health Status Rating: very good Typical amount of pain: some Does pain affect daily life?: (!) yes  Dietary Habits and Nutritional Risks How many meals a day?: 3 Eats fruit and vegetables daily?: yes Most meals are obtained by: having others provide food In the last 2 weeks, have you had any of the following?: none Diabetic:: (!) yes Any non-healing wounds?: no How often do you check your BS?: 1 Would you like to be referred to a Nutritionist or for Diabetic Management? : no  Functional Status Activities of Daily Living (to include ambulation/medication): Independent Ambulation: Independent with device- listed below Home Assistive Devices/Equipment: Walker (specify Type) Medication Administration: Independent Home Management: Needs assistance (comment) Manage your own finances?: yes Primary transportation is: facility / other Concerns about vision?: no *vision screening is required for WTM* Concerns about hearing?: no  Fall Screening Falls in the past year?: 1 Number of falls in past year: 0 Was there an injury with Fall?: 0 Fall Risk Category Calculator: 1 Patient Fall Risk Level: Low Fall Risk  Fall Risk Patient at Risk for Falls Due to: History of fall(s); Impaired balance/gait; Impaired mobility Fall risk Follow up: Falls evaluation completed  Home and Transportation Safety: All rugs have non-skid backing?: N/A, no  rugs All stairs or steps have railings?: yes Grab bars in the bathtub or shower?: yes Have non-skid surface in bathtub or shower?: yes Good home lighting?: yes Regular seat belt use?: yes Hospital stays in the last year:: no  Cognitive Assessment Difficulty concentrating, remembering, or making decisions? : no  Advance Directives (For Healthcare) Does Patient Have a Medical Advance Directive?: Yes Does patient want to make changes to medical advance directive?: No - Patient declined Type of Advance Directive: Out of facility DNR (pink MOST or yellow form) Out of facility DNR (pink MOST or yellow form) in Chart? (Ambulatory ONLY): (S) No - copy requested ([facility] a copy from [facility]) Pre-existing out of facility DNR order (yellow form or pink MOST form): Yellow form placed in chart (order not valid for inpatient use) Would patient like information on creating a medical advance directive?: No - Patient declined  Reviewed/Updated  Reviewed/Updated: Reviewed All (Medical, Surgical, Family, Medications, Allergies, Care Teams, Patient Goals)        Objective:    Today's Vitals   05/05/24 0858  BP: (!) 177/85  Pulse: 67  Resp: 18  Temp: 98 F (36.7 C)  SpO2: 99%  Weight: 135 lb (61.2 kg)  Height: 5' 2 (1.575 m)   Body  mass index is 24.69 kg/m.  Current Medications (verified) Outpatient Encounter Medications as of 05/05/2024  Medication Sig   Acetaminophen  325 MG CAPS Take 650 mg by mouth every 4 (four) hours as needed (for pain).   aluminum-magnesium hydroxide 200-200 MG/5ML suspension Take 10 mLs by mouth every 4 (four) hours as needed for indigestion.   ARIPiprazole  (ABILIFY ) 5 MG tablet Take 1 tablet (5 mg total) by mouth at bedtime.   bismuth subsalicylate (PEPTO BISMOL) 262 MG/15ML suspension Take 30 mLs by mouth as needed.   carbamide peroxide (DEBROX) 6.5 % OTIC solution Place 5 drops into both ears as needed.   cetirizine (ZYRTEC) 5 MG tablet Take 5 mg by mouth  daily as needed for allergies.   dextromethorphan-guaiFENesin (ROBITUSSIN-DM) 10-100 MG/5ML liquid Take 10 mLs by mouth every 4 (four) hours as needed for cough.   ezetimibe  (ZETIA ) 10 MG tablet Take 1 tablet (10 mg total) by mouth daily.   Glucose 15 GM/32ML GEL Take 1 packet by mouth as needed.   glucose blood test strip Use to test blood sugar twice daily. Dx:E11.65   ipratropium (ATROVENT ) 0.03 % nasal spray Place 2 sprays into both nostrils 2 (two) times daily.   latanoprost  (XALATAN ) 0.005 % ophthalmic solution Place 1 drop into both eyes at bedtime. 1 drop per eye hour of sleep   Magnesium Hydroxide (MILK OF MAGNESIA PO) Take 10 mLs by mouth as needed.   meclizine  (ANTIVERT ) 25 MG tablet Take 1 tablet (25 mg total) by mouth 3 (three) times daily as needed for dizziness.   metFORMIN  (GLUMETZA ) 500 MG (MOD) 24 hr tablet Take 1,500 mg by mouth daily.   Multiple Vitamins-Minerals (CENTRUM SILVER 50+WOMEN PO) Take 1 tablet by mouth daily. am   nystatin (MYCOSTATIN/NYSTOP) powder Apply 1 Application topically as needed.   ondansetron  (ZOFRAN ) 4 MG tablet Take 4 mg by mouth every 8 (eight) hours as needed for nausea or vomiting.   OXYGEN Inhale into the lungs. 2lpm as needed   vitamin B-12 (CYANOCOBALAMIN) 500 MCG tablet Take 1 tablet (500 mcg total) by mouth daily. dinner   No facility-administered encounter medications on file as of 05/05/2024.   Hearing/Vision screen No results found. Immunizations and Health Maintenance Health Maintenance  Topic Date Due   DTaP/Tdap/Td (2 - Td or Tdap) 05/12/2020   OPHTHALMOLOGY EXAM  10/18/2023   COVID-19 Vaccine (9 - 2025-26 season) 02/10/2024   HEMOGLOBIN A1C  07/08/2024   FOOT EXAM  05/05/2025   Medicare Annual Wellness (AWV)  05/05/2025   Pneumococcal Vaccine: 50+ Years  Completed   Influenza Vaccine  Completed   Zoster Vaccines- Shingrix  Completed   Meningococcal B Vaccine  Aged Out   Bone Density Scan  Discontinued         Assessment/Plan:  This is a routine wellness examination for Aviela.  Patient Care Team: Laurence Locus, DO as PCP - General (Internal Medicine) Pa, [facility])  I have personally reviewed and noted the following in the patient's chart:   Medical and social history Use of alcohol, tobacco or illicit drugs  Current medications and supplements including opioid prescriptions. Functional ability and status Nutritional status Physical activity Advanced directives List of other physicians Hospitalizations, surgeries, and ER visits in previous 12 months Vitals Screenings to include cognitive, depression, and falls Referrals and appointments  Orders Placed This Encounter  Procedures   Do not attempt resuscitation (DNR)    If patient has no pulse and is not breathing:   Do  Not Attempt Resuscitation    If patient has a pulse and/or is breathing: Medical Treatment Goals:   LIMITED ADDITIONAL INTERVENTIONS: Use medication/IV fluids and cardiac monitoring as indicated; Do not use intubation or mechanical ventilation (DNI), also provide comfort medications.  Transfer to Progressive/Stepdown as indicated, avoid Intensive Care.    Consent::   Discussion documented in EHR or advanced directives reviewed   In addition, I have reviewed and discussed with patient certain preventive protocols, quality metrics, and best practice recommendations. A written personalized care plan for preventive services as well as general preventive health recommendations were provided to patient.   Harlene MARLA An, NP   05/05/2024   Return in 1 year (on 05/05/2025). For AWV

## 2024-05-05 NOTE — Patient Instructions (Signed)
 Ms. Kristin Deleon,  Thank you for taking the time for your Medicare Wellness Visit. I appreciate your continued commitment to your health goals. Please review the care plan we discussed, and feel free to reach out if I can assist you further.  Please note that Annual Wellness Visits do not include a physical exam. Some assessments may be limited, especially if the visit was conducted virtually. If needed, we may recommend an in-person follow-up with your provider.  Ongoing Care Seeing your primary care provider every 3 to 6 months helps us  monitor your health and provide consistent, personalized care.   Referrals If a referral was made during today's visit and you haven't received any updates within two weeks, please contact the referred provider directly to check on the status.  Recommended Screenings:  Health Maintenance  Topic Date Due   Osteoporosis screening with Bone Density Scan  Never done   DTaP/Tdap/Td vaccine (2 - Td or Tdap) 05/12/2020   Medicare Annual Wellness Visit  08/22/2023   Eye exam for diabetics  10/18/2023   COVID-19 Vaccine (9 - 2025-26 season) 02/10/2024   Complete foot exam   03/26/2024   Hemoglobin A1C  07/08/2024   Pneumococcal Vaccine for age over 38  Completed   Flu Shot  Completed   Zoster (Shingles) Vaccine  Completed   Meningitis B Vaccine  Aged Out       05/05/2024    2:20 PM  Advanced Directives  Does Patient Have a Medical Advance Directive? Yes  Type of Advance Directive Out of facility DNR (pink MOST or yellow form)  Pre-existing out of facility DNR order (yellow form or pink MOST form) Yellow form placed in chart (order not valid for inpatient use)    Vision: Annual vision screenings are recommended for early detection of glaucoma, cataracts, and diabetic retinopathy. These exams can also reveal signs of chronic conditions such as diabetes and high blood pressure.  Dental: Annual dental screenings help detect early signs of oral cancer, gum  disease, and other conditions linked to overall health, including heart disease and diabetes.  Please see the attached documents for additional preventive care recommendations.
